# Patient Record
Sex: Female | Born: 1945
Health system: Southern US, Community
[De-identification: ages and names within clinical notes are randomized; demographics above are authoritative.]

## PROBLEM LIST (undated history)

## (undated) DIAGNOSIS — T8859XA Other complications of anesthesia, initial encounter: Secondary | ICD-10-CM

## (undated) DIAGNOSIS — J45909 Unspecified asthma, uncomplicated: Secondary | ICD-10-CM

## (undated) HISTORY — PX: DILATION AND CURETTAGE OF UTERUS: SHX78

## (undated) HISTORY — PX: INNER EAR SURGERY: SHX679

---

## 2013-07-05 DIAGNOSIS — Z79899 Other long term (current) drug therapy: Secondary | ICD-10-CM | POA: Diagnosis not present

## 2013-07-05 DIAGNOSIS — L259 Unspecified contact dermatitis, unspecified cause: Secondary | ICD-10-CM | POA: Diagnosis not present

## 2013-09-19 DIAGNOSIS — L259 Unspecified contact dermatitis, unspecified cause: Secondary | ICD-10-CM | POA: Diagnosis not present

## 2013-09-21 DIAGNOSIS — L259 Unspecified contact dermatitis, unspecified cause: Secondary | ICD-10-CM | POA: Diagnosis not present

## 2013-09-22 DIAGNOSIS — L259 Unspecified contact dermatitis, unspecified cause: Secondary | ICD-10-CM | POA: Diagnosis not present

## 2013-09-27 DIAGNOSIS — Z79899 Other long term (current) drug therapy: Secondary | ICD-10-CM | POA: Diagnosis not present

## 2013-09-27 DIAGNOSIS — L259 Unspecified contact dermatitis, unspecified cause: Secondary | ICD-10-CM | POA: Diagnosis not present

## 2014-01-17 DIAGNOSIS — Z79899 Other long term (current) drug therapy: Secondary | ICD-10-CM | POA: Diagnosis not present

## 2014-01-17 DIAGNOSIS — L259 Unspecified contact dermatitis, unspecified cause: Secondary | ICD-10-CM | POA: Diagnosis not present

## 2014-02-13 DIAGNOSIS — H9190 Unspecified hearing loss, unspecified ear: Secondary | ICD-10-CM | POA: Diagnosis not present

## 2014-02-13 DIAGNOSIS — H6120 Impacted cerumen, unspecified ear: Secondary | ICD-10-CM | POA: Diagnosis not present

## 2014-02-15 DIAGNOSIS — H9190 Unspecified hearing loss, unspecified ear: Secondary | ICD-10-CM | POA: Diagnosis not present

## 2014-02-15 DIAGNOSIS — H6120 Impacted cerumen, unspecified ear: Secondary | ICD-10-CM | POA: Diagnosis not present

## 2014-04-25 DIAGNOSIS — Z5181 Encounter for therapeutic drug level monitoring: Secondary | ICD-10-CM | POA: Diagnosis not present

## 2014-04-25 DIAGNOSIS — Z0000 Encounter for general adult medical examination without abnormal findings: Secondary | ICD-10-CM | POA: Diagnosis not present

## 2014-04-25 DIAGNOSIS — Z800 Family history of malignant neoplasm of digestive organs: Secondary | ICD-10-CM | POA: Diagnosis not present

## 2014-04-25 DIAGNOSIS — Z79899 Other long term (current) drug therapy: Secondary | ICD-10-CM | POA: Diagnosis not present

## 2014-04-25 DIAGNOSIS — L239 Allergic contact dermatitis, unspecified cause: Secondary | ICD-10-CM | POA: Diagnosis not present

## 2014-05-23 DIAGNOSIS — Z86010 Personal history of colonic polyps: Secondary | ICD-10-CM | POA: Diagnosis not present

## 2014-05-23 DIAGNOSIS — Z800 Family history of malignant neoplasm of digestive organs: Secondary | ICD-10-CM | POA: Diagnosis not present

## 2014-05-23 DIAGNOSIS — R194 Change in bowel habit: Secondary | ICD-10-CM | POA: Diagnosis not present

## 2014-05-23 DIAGNOSIS — R159 Full incontinence of feces: Secondary | ICD-10-CM | POA: Diagnosis not present

## 2014-06-26 DIAGNOSIS — D122 Benign neoplasm of ascending colon: Secondary | ICD-10-CM | POA: Diagnosis not present

## 2014-06-26 DIAGNOSIS — Z880 Allergy status to penicillin: Secondary | ICD-10-CM | POA: Diagnosis not present

## 2014-06-26 DIAGNOSIS — M1990 Unspecified osteoarthritis, unspecified site: Secondary | ICD-10-CM | POA: Diagnosis not present

## 2014-06-26 DIAGNOSIS — J45909 Unspecified asthma, uncomplicated: Secondary | ICD-10-CM | POA: Diagnosis not present

## 2014-06-26 DIAGNOSIS — F17210 Nicotine dependence, cigarettes, uncomplicated: Secondary | ICD-10-CM | POA: Diagnosis not present

## 2014-06-26 DIAGNOSIS — Z86010 Personal history of colonic polyps: Secondary | ICD-10-CM | POA: Diagnosis not present

## 2014-06-26 DIAGNOSIS — Z1211 Encounter for screening for malignant neoplasm of colon: Secondary | ICD-10-CM | POA: Diagnosis not present

## 2014-06-26 DIAGNOSIS — D120 Benign neoplasm of cecum: Secondary | ICD-10-CM | POA: Diagnosis not present

## 2014-06-26 DIAGNOSIS — L309 Dermatitis, unspecified: Secondary | ICD-10-CM | POA: Diagnosis not present

## 2014-06-26 DIAGNOSIS — K5790 Diverticulosis of intestine, part unspecified, without perforation or abscess without bleeding: Secondary | ICD-10-CM | POA: Diagnosis not present

## 2014-06-26 DIAGNOSIS — Z800 Family history of malignant neoplasm of digestive organs: Secondary | ICD-10-CM | POA: Diagnosis not present

## 2014-07-25 DIAGNOSIS — Z79899 Other long term (current) drug therapy: Secondary | ICD-10-CM | POA: Diagnosis not present

## 2014-07-25 DIAGNOSIS — Z5181 Encounter for therapeutic drug level monitoring: Secondary | ICD-10-CM | POA: Diagnosis not present

## 2014-07-25 DIAGNOSIS — F17210 Nicotine dependence, cigarettes, uncomplicated: Secondary | ICD-10-CM | POA: Diagnosis not present

## 2014-07-25 DIAGNOSIS — Z23 Encounter for immunization: Secondary | ICD-10-CM | POA: Diagnosis not present

## 2014-07-25 DIAGNOSIS — L299 Pruritus, unspecified: Secondary | ICD-10-CM | POA: Diagnosis not present

## 2014-07-25 DIAGNOSIS — L309 Dermatitis, unspecified: Secondary | ICD-10-CM | POA: Diagnosis not present

## 2014-07-25 DIAGNOSIS — L239 Allergic contact dermatitis, unspecified cause: Secondary | ICD-10-CM | POA: Diagnosis not present

## 2014-07-25 DIAGNOSIS — J45909 Unspecified asthma, uncomplicated: Secondary | ICD-10-CM | POA: Diagnosis not present

## 2014-08-14 DIAGNOSIS — H5213 Myopia, bilateral: Secondary | ICD-10-CM | POA: Diagnosis not present

## 2014-08-14 DIAGNOSIS — H25813 Combined forms of age-related cataract, bilateral: Secondary | ICD-10-CM | POA: Diagnosis not present

## 2014-08-14 DIAGNOSIS — H40003 Preglaucoma, unspecified, bilateral: Secondary | ICD-10-CM | POA: Diagnosis not present

## 2014-08-14 DIAGNOSIS — H524 Presbyopia: Secondary | ICD-10-CM | POA: Diagnosis not present

## 2014-09-08 DIAGNOSIS — K5900 Constipation, unspecified: Secondary | ICD-10-CM | POA: Diagnosis not present

## 2014-09-08 DIAGNOSIS — K5730 Diverticulosis of large intestine without perforation or abscess without bleeding: Secondary | ICD-10-CM | POA: Diagnosis not present

## 2014-09-08 DIAGNOSIS — R197 Diarrhea, unspecified: Secondary | ICD-10-CM | POA: Diagnosis not present

## 2014-09-08 DIAGNOSIS — Z86010 Personal history of colonic polyps: Secondary | ICD-10-CM | POA: Diagnosis not present

## 2014-09-08 DIAGNOSIS — R159 Full incontinence of feces: Secondary | ICD-10-CM | POA: Diagnosis not present

## 2014-11-01 DIAGNOSIS — Z79899 Other long term (current) drug therapy: Secondary | ICD-10-CM | POA: Diagnosis not present

## 2014-11-01 DIAGNOSIS — L239 Allergic contact dermatitis, unspecified cause: Secondary | ICD-10-CM | POA: Diagnosis not present

## 2014-11-01 DIAGNOSIS — Z5181 Encounter for therapeutic drug level monitoring: Secondary | ICD-10-CM | POA: Diagnosis not present

## 2015-01-26 DIAGNOSIS — Z23 Encounter for immunization: Secondary | ICD-10-CM | POA: Diagnosis not present

## 2015-01-31 DIAGNOSIS — L239 Allergic contact dermatitis, unspecified cause: Secondary | ICD-10-CM | POA: Diagnosis not present

## 2015-01-31 DIAGNOSIS — F17210 Nicotine dependence, cigarettes, uncomplicated: Secondary | ICD-10-CM | POA: Diagnosis not present

## 2015-01-31 DIAGNOSIS — T781XXA Other adverse food reactions, not elsewhere classified, initial encounter: Secondary | ICD-10-CM | POA: Diagnosis not present

## 2015-01-31 DIAGNOSIS — R197 Diarrhea, unspecified: Secondary | ICD-10-CM | POA: Diagnosis not present

## 2015-01-31 DIAGNOSIS — H1010 Acute atopic conjunctivitis, unspecified eye: Secondary | ICD-10-CM | POA: Diagnosis not present

## 2015-01-31 DIAGNOSIS — Z8709 Personal history of other diseases of the respiratory system: Secondary | ICD-10-CM | POA: Diagnosis not present

## 2015-02-02 DIAGNOSIS — Z79899 Other long term (current) drug therapy: Secondary | ICD-10-CM | POA: Diagnosis not present

## 2015-02-02 DIAGNOSIS — L2389 Allergic contact dermatitis due to other agents: Secondary | ICD-10-CM | POA: Diagnosis not present

## 2015-02-02 DIAGNOSIS — Z5181 Encounter for therapeutic drug level monitoring: Secondary | ICD-10-CM | POA: Diagnosis not present

## 2015-02-02 DIAGNOSIS — Z880 Allergy status to penicillin: Secondary | ICD-10-CM | POA: Diagnosis not present

## 2015-07-31 DIAGNOSIS — Z880 Allergy status to penicillin: Secondary | ICD-10-CM | POA: Diagnosis not present

## 2015-07-31 DIAGNOSIS — Z5181 Encounter for therapeutic drug level monitoring: Secondary | ICD-10-CM | POA: Diagnosis not present

## 2015-07-31 DIAGNOSIS — L2389 Allergic contact dermatitis due to other agents: Secondary | ICD-10-CM | POA: Diagnosis not present

## 2015-07-31 DIAGNOSIS — J45909 Unspecified asthma, uncomplicated: Secondary | ICD-10-CM | POA: Diagnosis not present

## 2015-07-31 DIAGNOSIS — Z79899 Other long term (current) drug therapy: Secondary | ICD-10-CM | POA: Diagnosis not present

## 2015-07-31 DIAGNOSIS — Z87891 Personal history of nicotine dependence: Secondary | ICD-10-CM | POA: Diagnosis not present

## 2015-08-23 DIAGNOSIS — K219 Gastro-esophageal reflux disease without esophagitis: Secondary | ICD-10-CM | POA: Diagnosis not present

## 2015-09-26 DIAGNOSIS — Z79899 Other long term (current) drug therapy: Secondary | ICD-10-CM | POA: Diagnosis not present

## 2015-09-26 DIAGNOSIS — Z880 Allergy status to penicillin: Secondary | ICD-10-CM | POA: Diagnosis not present

## 2015-09-26 DIAGNOSIS — J45909 Unspecified asthma, uncomplicated: Secondary | ICD-10-CM | POA: Diagnosis not present

## 2015-09-26 DIAGNOSIS — L2389 Allergic contact dermatitis due to other agents: Secondary | ICD-10-CM | POA: Diagnosis not present

## 2015-09-26 DIAGNOSIS — Z87891 Personal history of nicotine dependence: Secondary | ICD-10-CM | POA: Diagnosis not present

## 2015-09-26 DIAGNOSIS — Z5181 Encounter for therapeutic drug level monitoring: Secondary | ICD-10-CM | POA: Diagnosis not present

## 2015-11-23 DIAGNOSIS — K219 Gastro-esophageal reflux disease without esophagitis: Secondary | ICD-10-CM | POA: Diagnosis not present

## 2015-11-23 DIAGNOSIS — J45909 Unspecified asthma, uncomplicated: Secondary | ICD-10-CM | POA: Diagnosis not present

## 2016-01-08 DIAGNOSIS — Z5181 Encounter for therapeutic drug level monitoring: Secondary | ICD-10-CM | POA: Diagnosis not present

## 2016-01-08 DIAGNOSIS — J45909 Unspecified asthma, uncomplicated: Secondary | ICD-10-CM | POA: Diagnosis not present

## 2016-01-08 DIAGNOSIS — Z880 Allergy status to penicillin: Secondary | ICD-10-CM | POA: Diagnosis not present

## 2016-01-08 DIAGNOSIS — Z87891 Personal history of nicotine dependence: Secondary | ICD-10-CM | POA: Diagnosis not present

## 2016-01-08 DIAGNOSIS — L2389 Allergic contact dermatitis due to other agents: Secondary | ICD-10-CM | POA: Diagnosis not present

## 2016-01-08 DIAGNOSIS — Z79899 Other long term (current) drug therapy: Secondary | ICD-10-CM | POA: Diagnosis not present

## 2016-03-03 DIAGNOSIS — J4520 Mild intermittent asthma, uncomplicated: Secondary | ICD-10-CM | POA: Diagnosis not present

## 2016-03-03 DIAGNOSIS — Z1231 Encounter for screening mammogram for malignant neoplasm of breast: Secondary | ICD-10-CM | POA: Diagnosis not present

## 2016-03-03 DIAGNOSIS — Z23 Encounter for immunization: Secondary | ICD-10-CM | POA: Diagnosis not present

## 2016-03-03 DIAGNOSIS — J449 Chronic obstructive pulmonary disease, unspecified: Secondary | ICD-10-CM | POA: Diagnosis not present

## 2016-04-10 DIAGNOSIS — Z83518 Family history of other specified eye disorder: Secondary | ICD-10-CM | POA: Diagnosis not present

## 2016-04-10 DIAGNOSIS — Z809 Family history of malignant neoplasm, unspecified: Secondary | ICD-10-CM | POA: Diagnosis not present

## 2016-04-10 DIAGNOSIS — Z800 Family history of malignant neoplasm of digestive organs: Secondary | ICD-10-CM | POA: Diagnosis not present

## 2016-04-10 DIAGNOSIS — Z79891 Long term (current) use of opiate analgesic: Secondary | ICD-10-CM | POA: Diagnosis not present

## 2016-04-10 DIAGNOSIS — Z9109 Other allergy status, other than to drugs and biological substances: Secondary | ICD-10-CM | POA: Diagnosis not present

## 2016-04-10 DIAGNOSIS — Z79899 Other long term (current) drug therapy: Secondary | ICD-10-CM | POA: Diagnosis not present

## 2016-04-10 DIAGNOSIS — Z880 Allergy status to penicillin: Secondary | ICD-10-CM | POA: Diagnosis not present

## 2016-04-10 DIAGNOSIS — Z888 Allergy status to other drugs, medicaments and biological substances status: Secondary | ICD-10-CM | POA: Diagnosis not present

## 2016-04-10 DIAGNOSIS — Z8371 Family history of colonic polyps: Secondary | ICD-10-CM | POA: Diagnosis not present

## 2016-04-10 DIAGNOSIS — Z87891 Personal history of nicotine dependence: Secondary | ICD-10-CM | POA: Diagnosis not present

## 2016-04-10 DIAGNOSIS — Z91048 Other nonmedicinal substance allergy status: Secondary | ICD-10-CM | POA: Diagnosis not present

## 2016-04-10 DIAGNOSIS — Z9889 Other specified postprocedural states: Secondary | ICD-10-CM | POA: Diagnosis not present

## 2016-04-10 DIAGNOSIS — Z974 Presence of external hearing-aid: Secondary | ICD-10-CM | POA: Diagnosis not present

## 2016-04-10 DIAGNOSIS — Z8489 Family history of other specified conditions: Secondary | ICD-10-CM | POA: Diagnosis not present

## 2016-04-10 DIAGNOSIS — H8093 Unspecified otosclerosis, bilateral: Secondary | ICD-10-CM | POA: Diagnosis not present

## 2016-04-10 DIAGNOSIS — H90A32 Mixed conductive and sensorineural hearing loss, unilateral, left ear with restricted hearing on the contralateral side: Secondary | ICD-10-CM | POA: Diagnosis not present

## 2016-04-10 DIAGNOSIS — Z831 Family history of other infectious and parasitic diseases: Secondary | ICD-10-CM | POA: Diagnosis not present

## 2016-04-23 DIAGNOSIS — J449 Chronic obstructive pulmonary disease, unspecified: Secondary | ICD-10-CM | POA: Diagnosis not present

## 2016-04-23 DIAGNOSIS — H74312 Ankylosis of ear ossicles, left ear: Secondary | ICD-10-CM | POA: Diagnosis not present

## 2016-04-23 DIAGNOSIS — L239 Allergic contact dermatitis, unspecified cause: Secondary | ICD-10-CM | POA: Diagnosis not present

## 2016-04-23 DIAGNOSIS — H9012 Conductive hearing loss, unilateral, left ear, with unrestricted hearing on the contralateral side: Secondary | ICD-10-CM | POA: Diagnosis not present

## 2016-04-23 DIAGNOSIS — H61392 Other acquired stenosis of left external ear canal: Secondary | ICD-10-CM | POA: Diagnosis not present

## 2016-04-23 DIAGNOSIS — H8092 Unspecified otosclerosis, left ear: Secondary | ICD-10-CM | POA: Diagnosis not present

## 2016-04-23 DIAGNOSIS — K219 Gastro-esophageal reflux disease without esophagitis: Secondary | ICD-10-CM | POA: Diagnosis not present

## 2016-04-23 DIAGNOSIS — Z87891 Personal history of nicotine dependence: Secondary | ICD-10-CM | POA: Diagnosis not present

## 2016-04-24 DIAGNOSIS — H61392 Other acquired stenosis of left external ear canal: Secondary | ICD-10-CM | POA: Diagnosis not present

## 2016-04-24 DIAGNOSIS — H8092 Unspecified otosclerosis, left ear: Secondary | ICD-10-CM | POA: Diagnosis not present

## 2016-04-24 DIAGNOSIS — H9012 Conductive hearing loss, unilateral, left ear, with unrestricted hearing on the contralateral side: Secondary | ICD-10-CM | POA: Diagnosis not present

## 2016-04-24 DIAGNOSIS — H74312 Ankylosis of ear ossicles, left ear: Secondary | ICD-10-CM | POA: Diagnosis not present

## 2016-04-24 DIAGNOSIS — J449 Chronic obstructive pulmonary disease, unspecified: Secondary | ICD-10-CM | POA: Diagnosis not present

## 2016-04-24 DIAGNOSIS — K219 Gastro-esophageal reflux disease without esophagitis: Secondary | ICD-10-CM | POA: Diagnosis not present

## 2016-05-02 DIAGNOSIS — Z91048 Other nonmedicinal substance allergy status: Secondary | ICD-10-CM | POA: Diagnosis not present

## 2016-05-02 DIAGNOSIS — Z8489 Family history of other specified conditions: Secondary | ICD-10-CM | POA: Diagnosis not present

## 2016-05-02 DIAGNOSIS — Z9889 Other specified postprocedural states: Secondary | ICD-10-CM | POA: Diagnosis not present

## 2016-05-02 DIAGNOSIS — Z87891 Personal history of nicotine dependence: Secondary | ICD-10-CM | POA: Diagnosis not present

## 2016-05-02 DIAGNOSIS — H90A32 Mixed conductive and sensorineural hearing loss, unilateral, left ear with restricted hearing on the contralateral side: Secondary | ICD-10-CM | POA: Diagnosis not present

## 2016-05-02 DIAGNOSIS — Z83518 Family history of other specified eye disorder: Secondary | ICD-10-CM | POA: Diagnosis not present

## 2016-05-02 DIAGNOSIS — Z79891 Long term (current) use of opiate analgesic: Secondary | ICD-10-CM | POA: Diagnosis not present

## 2016-05-02 DIAGNOSIS — Z800 Family history of malignant neoplasm of digestive organs: Secondary | ICD-10-CM | POA: Diagnosis not present

## 2016-05-02 DIAGNOSIS — Z7951 Long term (current) use of inhaled steroids: Secondary | ICD-10-CM | POA: Diagnosis not present

## 2016-05-02 DIAGNOSIS — Z831 Family history of other infectious and parasitic diseases: Secondary | ICD-10-CM | POA: Diagnosis not present

## 2016-05-02 DIAGNOSIS — Z8371 Family history of colonic polyps: Secondary | ICD-10-CM | POA: Diagnosis not present

## 2016-05-02 DIAGNOSIS — H8092 Unspecified otosclerosis, left ear: Secondary | ICD-10-CM | POA: Diagnosis not present

## 2016-05-02 DIAGNOSIS — Z83511 Family history of glaucoma: Secondary | ICD-10-CM | POA: Diagnosis not present

## 2016-05-02 DIAGNOSIS — Z809 Family history of malignant neoplasm, unspecified: Secondary | ICD-10-CM | POA: Diagnosis not present

## 2016-05-02 DIAGNOSIS — Z79899 Other long term (current) drug therapy: Secondary | ICD-10-CM | POA: Diagnosis not present

## 2016-05-02 DIAGNOSIS — Z880 Allergy status to penicillin: Secondary | ICD-10-CM | POA: Diagnosis not present

## 2016-05-02 DIAGNOSIS — Z4889 Encounter for other specified surgical aftercare: Secondary | ICD-10-CM | POA: Diagnosis not present

## 2016-07-02 DIAGNOSIS — H40003 Preglaucoma, unspecified, bilateral: Secondary | ICD-10-CM | POA: Diagnosis not present

## 2016-07-02 DIAGNOSIS — H01004 Unspecified blepharitis left upper eyelid: Secondary | ICD-10-CM | POA: Diagnosis not present

## 2016-07-02 DIAGNOSIS — J4520 Mild intermittent asthma, uncomplicated: Secondary | ICD-10-CM | POA: Diagnosis not present

## 2016-07-03 DIAGNOSIS — H8092 Unspecified otosclerosis, left ear: Secondary | ICD-10-CM | POA: Diagnosis not present

## 2016-07-03 DIAGNOSIS — Z8371 Family history of colonic polyps: Secondary | ICD-10-CM | POA: Diagnosis not present

## 2016-07-03 DIAGNOSIS — Z880 Allergy status to penicillin: Secondary | ICD-10-CM | POA: Diagnosis not present

## 2016-07-03 DIAGNOSIS — Z8489 Family history of other specified conditions: Secondary | ICD-10-CM | POA: Diagnosis not present

## 2016-07-03 DIAGNOSIS — Z48810 Encounter for surgical aftercare following surgery on the sense organs: Secondary | ICD-10-CM | POA: Diagnosis not present

## 2016-07-03 DIAGNOSIS — Z91048 Other nonmedicinal substance allergy status: Secondary | ICD-10-CM | POA: Diagnosis not present

## 2016-07-03 DIAGNOSIS — Z809 Family history of malignant neoplasm, unspecified: Secondary | ICD-10-CM | POA: Diagnosis not present

## 2016-07-03 DIAGNOSIS — Z79899 Other long term (current) drug therapy: Secondary | ICD-10-CM | POA: Diagnosis not present

## 2016-07-03 DIAGNOSIS — Z87891 Personal history of nicotine dependence: Secondary | ICD-10-CM | POA: Diagnosis not present

## 2016-07-03 DIAGNOSIS — Z83511 Family history of glaucoma: Secondary | ICD-10-CM | POA: Diagnosis not present

## 2016-07-03 DIAGNOSIS — Z7951 Long term (current) use of inhaled steroids: Secondary | ICD-10-CM | POA: Diagnosis not present

## 2016-07-03 DIAGNOSIS — Z800 Family history of malignant neoplasm of digestive organs: Secondary | ICD-10-CM | POA: Diagnosis not present

## 2016-07-08 DIAGNOSIS — H90A32 Mixed conductive and sensorineural hearing loss, unilateral, left ear with restricted hearing on the contralateral side: Secondary | ICD-10-CM | POA: Diagnosis not present

## 2016-08-14 DIAGNOSIS — H52203 Unspecified astigmatism, bilateral: Secondary | ICD-10-CM | POA: Diagnosis not present

## 2016-08-14 DIAGNOSIS — H2513 Age-related nuclear cataract, bilateral: Secondary | ICD-10-CM | POA: Diagnosis not present

## 2016-08-14 DIAGNOSIS — H5213 Myopia, bilateral: Secondary | ICD-10-CM | POA: Diagnosis not present

## 2016-08-14 DIAGNOSIS — H524 Presbyopia: Secondary | ICD-10-CM | POA: Diagnosis not present

## 2016-08-14 DIAGNOSIS — H40013 Open angle with borderline findings, low risk, bilateral: Secondary | ICD-10-CM | POA: Diagnosis not present

## 2016-08-26 DIAGNOSIS — C44311 Basal cell carcinoma of skin of nose: Secondary | ICD-10-CM | POA: Diagnosis not present

## 2016-08-26 DIAGNOSIS — Z79899 Other long term (current) drug therapy: Secondary | ICD-10-CM | POA: Diagnosis not present

## 2016-08-26 DIAGNOSIS — Z87891 Personal history of nicotine dependence: Secondary | ICD-10-CM | POA: Diagnosis not present

## 2016-08-26 DIAGNOSIS — L2389 Allergic contact dermatitis due to other agents: Secondary | ICD-10-CM | POA: Diagnosis not present

## 2016-08-26 DIAGNOSIS — Z5181 Encounter for therapeutic drug level monitoring: Secondary | ICD-10-CM | POA: Diagnosis not present

## 2016-08-26 DIAGNOSIS — J45909 Unspecified asthma, uncomplicated: Secondary | ICD-10-CM | POA: Diagnosis not present

## 2016-08-26 DIAGNOSIS — Z880 Allergy status to penicillin: Secondary | ICD-10-CM | POA: Diagnosis not present

## 2016-08-26 DIAGNOSIS — D485 Neoplasm of uncertain behavior of skin: Secondary | ICD-10-CM | POA: Diagnosis not present

## 2016-09-11 DIAGNOSIS — H40013 Open angle with borderline findings, low risk, bilateral: Secondary | ICD-10-CM | POA: Diagnosis not present

## 2016-09-24 DIAGNOSIS — H40013 Open angle with borderline findings, low risk, bilateral: Secondary | ICD-10-CM | POA: Diagnosis not present

## 2016-09-24 DIAGNOSIS — H40023 Open angle with borderline findings, high risk, bilateral: Secondary | ICD-10-CM | POA: Diagnosis not present

## 2016-09-24 DIAGNOSIS — H2513 Age-related nuclear cataract, bilateral: Secondary | ICD-10-CM | POA: Diagnosis not present

## 2016-10-18 DIAGNOSIS — J0100 Acute maxillary sinusitis, unspecified: Secondary | ICD-10-CM | POA: Diagnosis not present

## 2016-10-20 DIAGNOSIS — Z880 Allergy status to penicillin: Secondary | ICD-10-CM | POA: Diagnosis not present

## 2016-10-20 DIAGNOSIS — Z87891 Personal history of nicotine dependence: Secondary | ICD-10-CM | POA: Diagnosis not present

## 2016-10-20 DIAGNOSIS — C44311 Basal cell carcinoma of skin of nose: Secondary | ICD-10-CM | POA: Diagnosis not present

## 2016-10-30 DIAGNOSIS — Z4802 Encounter for removal of sutures: Secondary | ICD-10-CM | POA: Diagnosis not present

## 2016-10-30 DIAGNOSIS — C44311 Basal cell carcinoma of skin of nose: Secondary | ICD-10-CM | POA: Diagnosis not present

## 2016-10-30 DIAGNOSIS — Z483 Aftercare following surgery for neoplasm: Secondary | ICD-10-CM | POA: Diagnosis not present

## 2016-10-31 DIAGNOSIS — R197 Diarrhea, unspecified: Secondary | ICD-10-CM | POA: Diagnosis not present

## 2016-10-31 DIAGNOSIS — J45909 Unspecified asthma, uncomplicated: Secondary | ICD-10-CM | POA: Diagnosis not present

## 2016-10-31 DIAGNOSIS — R509 Fever, unspecified: Secondary | ICD-10-CM | POA: Diagnosis not present

## 2016-10-31 DIAGNOSIS — J069 Acute upper respiratory infection, unspecified: Secondary | ICD-10-CM | POA: Diagnosis not present

## 2016-11-26 DIAGNOSIS — J45909 Unspecified asthma, uncomplicated: Secondary | ICD-10-CM | POA: Diagnosis not present

## 2016-11-26 DIAGNOSIS — Z880 Allergy status to penicillin: Secondary | ICD-10-CM | POA: Diagnosis not present

## 2016-11-26 DIAGNOSIS — Z85828 Personal history of other malignant neoplasm of skin: Secondary | ICD-10-CM | POA: Diagnosis not present

## 2016-11-26 DIAGNOSIS — Z5181 Encounter for therapeutic drug level monitoring: Secondary | ICD-10-CM | POA: Diagnosis not present

## 2016-11-26 DIAGNOSIS — Z79899 Other long term (current) drug therapy: Secondary | ICD-10-CM | POA: Diagnosis not present

## 2016-11-26 DIAGNOSIS — L2389 Allergic contact dermatitis due to other agents: Secondary | ICD-10-CM | POA: Diagnosis not present

## 2016-11-26 DIAGNOSIS — Z87891 Personal history of nicotine dependence: Secondary | ICD-10-CM | POA: Diagnosis not present

## 2016-12-10 DIAGNOSIS — Z483 Aftercare following surgery for neoplasm: Secondary | ICD-10-CM | POA: Diagnosis not present

## 2016-12-10 DIAGNOSIS — C44311 Basal cell carcinoma of skin of nose: Secondary | ICD-10-CM | POA: Diagnosis not present

## 2017-01-12 DIAGNOSIS — H40013 Open angle with borderline findings, low risk, bilateral: Secondary | ICD-10-CM | POA: Diagnosis not present

## 2017-01-12 DIAGNOSIS — H25013 Cortical age-related cataract, bilateral: Secondary | ICD-10-CM | POA: Diagnosis not present

## 2017-01-12 DIAGNOSIS — H2511 Age-related nuclear cataract, right eye: Secondary | ICD-10-CM | POA: Diagnosis not present

## 2017-01-12 DIAGNOSIS — H25011 Cortical age-related cataract, right eye: Secondary | ICD-10-CM | POA: Diagnosis not present

## 2017-01-12 DIAGNOSIS — H15842 Scleral ectasia, left eye: Secondary | ICD-10-CM | POA: Diagnosis not present

## 2017-01-12 DIAGNOSIS — H2513 Age-related nuclear cataract, bilateral: Secondary | ICD-10-CM | POA: Diagnosis not present

## 2017-01-26 DIAGNOSIS — H40013 Open angle with borderline findings, low risk, bilateral: Secondary | ICD-10-CM | POA: Diagnosis not present

## 2017-01-26 DIAGNOSIS — H2513 Age-related nuclear cataract, bilateral: Secondary | ICD-10-CM | POA: Diagnosis not present

## 2017-03-31 DIAGNOSIS — H2511 Age-related nuclear cataract, right eye: Secondary | ICD-10-CM | POA: Diagnosis not present

## 2017-03-31 DIAGNOSIS — H268 Other specified cataract: Secondary | ICD-10-CM | POA: Diagnosis not present

## 2017-04-15 DIAGNOSIS — H25012 Cortical age-related cataract, left eye: Secondary | ICD-10-CM | POA: Diagnosis not present

## 2017-04-15 DIAGNOSIS — H2512 Age-related nuclear cataract, left eye: Secondary | ICD-10-CM | POA: Diagnosis not present

## 2017-04-21 DIAGNOSIS — H25812 Combined forms of age-related cataract, left eye: Secondary | ICD-10-CM | POA: Diagnosis not present

## 2017-04-21 DIAGNOSIS — H2512 Age-related nuclear cataract, left eye: Secondary | ICD-10-CM | POA: Diagnosis not present

## 2017-05-28 DIAGNOSIS — L821 Other seborrheic keratosis: Secondary | ICD-10-CM | POA: Diagnosis not present

## 2017-05-28 DIAGNOSIS — Z7189 Other specified counseling: Secondary | ICD-10-CM | POA: Diagnosis not present

## 2017-05-28 DIAGNOSIS — Z79899 Other long term (current) drug therapy: Secondary | ICD-10-CM | POA: Diagnosis not present

## 2017-05-28 DIAGNOSIS — L2389 Allergic contact dermatitis due to other agents: Secondary | ICD-10-CM | POA: Diagnosis not present

## 2017-05-28 DIAGNOSIS — Z5181 Encounter for therapeutic drug level monitoring: Secondary | ICD-10-CM | POA: Diagnosis not present

## 2017-06-08 DIAGNOSIS — H47022 Hemorrhage in optic nerve sheath, left eye: Secondary | ICD-10-CM | POA: Diagnosis not present

## 2017-06-10 DIAGNOSIS — Z23 Encounter for immunization: Secondary | ICD-10-CM | POA: Diagnosis not present

## 2017-06-23 HISTORY — PX: EYE SURGERY: SHX253

## 2017-07-15 DIAGNOSIS — H1013 Acute atopic conjunctivitis, bilateral: Secondary | ICD-10-CM | POA: Diagnosis not present

## 2017-07-15 DIAGNOSIS — H40013 Open angle with borderline findings, low risk, bilateral: Secondary | ICD-10-CM | POA: Diagnosis not present

## 2017-09-21 DIAGNOSIS — H35439 Paving stone degeneration of retina, unspecified eye: Secondary | ICD-10-CM | POA: Diagnosis not present

## 2017-09-21 DIAGNOSIS — Z961 Presence of intraocular lens: Secondary | ICD-10-CM | POA: Diagnosis not present

## 2017-09-21 DIAGNOSIS — H40013 Open angle with borderline findings, low risk, bilateral: Secondary | ICD-10-CM | POA: Diagnosis not present

## 2017-09-21 DIAGNOSIS — H31002 Unspecified chorioretinal scars, left eye: Secondary | ICD-10-CM | POA: Diagnosis not present

## 2017-10-21 DIAGNOSIS — J209 Acute bronchitis, unspecified: Secondary | ICD-10-CM | POA: Diagnosis not present

## 2017-10-21 DIAGNOSIS — J329 Chronic sinusitis, unspecified: Secondary | ICD-10-CM | POA: Diagnosis not present

## 2017-10-21 DIAGNOSIS — R32 Unspecified urinary incontinence: Secondary | ICD-10-CM | POA: Diagnosis not present

## 2017-10-21 DIAGNOSIS — J45909 Unspecified asthma, uncomplicated: Secondary | ICD-10-CM | POA: Diagnosis not present

## 2017-11-12 DIAGNOSIS — S30861A Insect bite (nonvenomous) of abdominal wall, initial encounter: Secondary | ICD-10-CM | POA: Diagnosis not present

## 2017-11-12 DIAGNOSIS — W57XXXA Bitten or stung by nonvenomous insect and other nonvenomous arthropods, initial encounter: Secondary | ICD-10-CM | POA: Diagnosis not present

## 2017-11-18 DIAGNOSIS — H40013 Open angle with borderline findings, low risk, bilateral: Secondary | ICD-10-CM | POA: Diagnosis not present

## 2017-11-18 DIAGNOSIS — H15842 Scleral ectasia, left eye: Secondary | ICD-10-CM | POA: Diagnosis not present

## 2017-11-18 DIAGNOSIS — H02839 Dermatochalasis of unspecified eye, unspecified eyelid: Secondary | ICD-10-CM | POA: Diagnosis not present

## 2017-11-18 DIAGNOSIS — H1013 Acute atopic conjunctivitis, bilateral: Secondary | ICD-10-CM | POA: Diagnosis not present

## 2017-11-26 DIAGNOSIS — Z79899 Other long term (current) drug therapy: Secondary | ICD-10-CM | POA: Diagnosis not present

## 2017-11-26 DIAGNOSIS — L2389 Allergic contact dermatitis due to other agents: Secondary | ICD-10-CM | POA: Diagnosis not present

## 2017-11-26 DIAGNOSIS — Z5181 Encounter for therapeutic drug level monitoring: Secondary | ICD-10-CM | POA: Diagnosis not present

## 2017-12-02 DIAGNOSIS — M65311 Trigger thumb, right thumb: Secondary | ICD-10-CM | POA: Diagnosis not present

## 2017-12-02 DIAGNOSIS — S30861D Insect bite (nonvenomous) of abdominal wall, subsequent encounter: Secondary | ICD-10-CM | POA: Diagnosis not present

## 2017-12-02 DIAGNOSIS — W57XXXA Bitten or stung by nonvenomous insect and other nonvenomous arthropods, initial encounter: Secondary | ICD-10-CM | POA: Diagnosis not present

## 2017-12-07 DIAGNOSIS — M65311 Trigger thumb, right thumb: Secondary | ICD-10-CM | POA: Diagnosis not present

## 2017-12-22 DIAGNOSIS — M184 Other bilateral secondary osteoarthritis of first carpometacarpal joints: Secondary | ICD-10-CM | POA: Diagnosis not present

## 2017-12-22 DIAGNOSIS — M25541 Pain in joints of right hand: Secondary | ICD-10-CM | POA: Diagnosis not present

## 2017-12-22 DIAGNOSIS — M65311 Trigger thumb, right thumb: Secondary | ICD-10-CM | POA: Diagnosis not present

## 2017-12-23 DIAGNOSIS — N952 Postmenopausal atrophic vaginitis: Secondary | ICD-10-CM | POA: Diagnosis not present

## 2017-12-23 DIAGNOSIS — N393 Stress incontinence (female) (male): Secondary | ICD-10-CM | POA: Diagnosis not present

## 2017-12-29 DIAGNOSIS — M25541 Pain in joints of right hand: Secondary | ICD-10-CM | POA: Diagnosis not present

## 2017-12-29 DIAGNOSIS — M65311 Trigger thumb, right thumb: Secondary | ICD-10-CM | POA: Diagnosis not present

## 2017-12-29 DIAGNOSIS — M184 Other bilateral secondary osteoarthritis of first carpometacarpal joints: Secondary | ICD-10-CM | POA: Diagnosis not present

## 2018-01-11 DIAGNOSIS — M6281 Muscle weakness (generalized): Secondary | ICD-10-CM | POA: Diagnosis not present

## 2018-01-11 DIAGNOSIS — N3946 Mixed incontinence: Secondary | ICD-10-CM | POA: Diagnosis not present

## 2018-01-19 DIAGNOSIS — M184 Other bilateral secondary osteoarthritis of first carpometacarpal joints: Secondary | ICD-10-CM | POA: Diagnosis not present

## 2018-01-19 DIAGNOSIS — M25541 Pain in joints of right hand: Secondary | ICD-10-CM | POA: Diagnosis not present

## 2018-01-19 DIAGNOSIS — M65311 Trigger thumb, right thumb: Secondary | ICD-10-CM | POA: Diagnosis not present

## 2018-01-22 DIAGNOSIS — M6289 Other specified disorders of muscle: Secondary | ICD-10-CM | POA: Diagnosis not present

## 2018-01-22 DIAGNOSIS — M62838 Other muscle spasm: Secondary | ICD-10-CM | POA: Diagnosis not present

## 2018-01-22 DIAGNOSIS — M6281 Muscle weakness (generalized): Secondary | ICD-10-CM | POA: Diagnosis not present

## 2018-01-22 DIAGNOSIS — N393 Stress incontinence (female) (male): Secondary | ICD-10-CM | POA: Diagnosis not present

## 2018-02-04 DIAGNOSIS — M25541 Pain in joints of right hand: Secondary | ICD-10-CM | POA: Diagnosis not present

## 2018-02-04 DIAGNOSIS — M184 Other bilateral secondary osteoarthritis of first carpometacarpal joints: Secondary | ICD-10-CM | POA: Diagnosis not present

## 2018-02-04 DIAGNOSIS — M65311 Trigger thumb, right thumb: Secondary | ICD-10-CM | POA: Diagnosis not present

## 2018-02-16 DIAGNOSIS — M6289 Other specified disorders of muscle: Secondary | ICD-10-CM | POA: Diagnosis not present

## 2018-02-16 DIAGNOSIS — M6281 Muscle weakness (generalized): Secondary | ICD-10-CM | POA: Diagnosis not present

## 2018-02-16 DIAGNOSIS — M62838 Other muscle spasm: Secondary | ICD-10-CM | POA: Diagnosis not present

## 2018-02-16 DIAGNOSIS — N393 Stress incontinence (female) (male): Secondary | ICD-10-CM | POA: Diagnosis not present

## 2018-02-18 DIAGNOSIS — H40013 Open angle with borderline findings, low risk, bilateral: Secondary | ICD-10-CM | POA: Diagnosis not present

## 2018-02-18 DIAGNOSIS — H15842 Scleral ectasia, left eye: Secondary | ICD-10-CM | POA: Diagnosis not present

## 2018-02-18 DIAGNOSIS — H02839 Dermatochalasis of unspecified eye, unspecified eyelid: Secondary | ICD-10-CM | POA: Diagnosis not present

## 2018-02-18 DIAGNOSIS — H1013 Acute atopic conjunctivitis, bilateral: Secondary | ICD-10-CM | POA: Diagnosis not present

## 2018-02-25 DIAGNOSIS — M170 Bilateral primary osteoarthritis of knee: Secondary | ICD-10-CM | POA: Diagnosis not present

## 2018-03-11 DIAGNOSIS — M6281 Muscle weakness (generalized): Secondary | ICD-10-CM | POA: Diagnosis not present

## 2018-03-11 DIAGNOSIS — M6289 Other specified disorders of muscle: Secondary | ICD-10-CM | POA: Diagnosis not present

## 2018-03-11 DIAGNOSIS — M62838 Other muscle spasm: Secondary | ICD-10-CM | POA: Diagnosis not present

## 2018-03-11 DIAGNOSIS — N393 Stress incontinence (female) (male): Secondary | ICD-10-CM | POA: Diagnosis not present

## 2018-03-17 DIAGNOSIS — M170 Bilateral primary osteoarthritis of knee: Secondary | ICD-10-CM | POA: Diagnosis not present

## 2018-03-19 DIAGNOSIS — R300 Dysuria: Secondary | ICD-10-CM | POA: Diagnosis not present

## 2018-03-24 DIAGNOSIS — N393 Stress incontinence (female) (male): Secondary | ICD-10-CM | POA: Diagnosis not present

## 2018-03-24 DIAGNOSIS — N952 Postmenopausal atrophic vaginitis: Secondary | ICD-10-CM | POA: Diagnosis not present

## 2018-04-16 DIAGNOSIS — N3946 Mixed incontinence: Secondary | ICD-10-CM | POA: Diagnosis not present

## 2018-04-16 DIAGNOSIS — M62838 Other muscle spasm: Secondary | ICD-10-CM | POA: Diagnosis not present

## 2018-04-16 DIAGNOSIS — M6289 Other specified disorders of muscle: Secondary | ICD-10-CM | POA: Diagnosis not present

## 2018-04-16 DIAGNOSIS — M6281 Muscle weakness (generalized): Secondary | ICD-10-CM | POA: Diagnosis not present

## 2018-05-05 DIAGNOSIS — M62838 Other muscle spasm: Secondary | ICD-10-CM | POA: Diagnosis not present

## 2018-05-05 DIAGNOSIS — M6281 Muscle weakness (generalized): Secondary | ICD-10-CM | POA: Diagnosis not present

## 2018-05-05 DIAGNOSIS — N3946 Mixed incontinence: Secondary | ICD-10-CM | POA: Diagnosis not present

## 2018-05-05 DIAGNOSIS — M6289 Other specified disorders of muscle: Secondary | ICD-10-CM | POA: Diagnosis not present

## 2018-05-10 DIAGNOSIS — H15842 Scleral ectasia, left eye: Secondary | ICD-10-CM | POA: Diagnosis not present

## 2018-05-10 DIAGNOSIS — H1013 Acute atopic conjunctivitis, bilateral: Secondary | ICD-10-CM | POA: Diagnosis not present

## 2018-05-10 DIAGNOSIS — H40013 Open angle with borderline findings, low risk, bilateral: Secondary | ICD-10-CM | POA: Diagnosis not present

## 2018-05-24 DIAGNOSIS — M62838 Other muscle spasm: Secondary | ICD-10-CM | POA: Diagnosis not present

## 2018-05-24 DIAGNOSIS — M6289 Other specified disorders of muscle: Secondary | ICD-10-CM | POA: Diagnosis not present

## 2018-05-24 DIAGNOSIS — M6281 Muscle weakness (generalized): Secondary | ICD-10-CM | POA: Diagnosis not present

## 2018-05-24 DIAGNOSIS — N393 Stress incontinence (female) (male): Secondary | ICD-10-CM | POA: Diagnosis not present

## 2018-05-26 DIAGNOSIS — L814 Other melanin hyperpigmentation: Secondary | ICD-10-CM | POA: Diagnosis not present

## 2018-05-26 DIAGNOSIS — Z87891 Personal history of nicotine dependence: Secondary | ICD-10-CM | POA: Diagnosis not present

## 2018-05-26 DIAGNOSIS — Z79899 Other long term (current) drug therapy: Secondary | ICD-10-CM | POA: Diagnosis not present

## 2018-05-26 DIAGNOSIS — L2389 Allergic contact dermatitis due to other agents: Secondary | ICD-10-CM | POA: Diagnosis not present

## 2018-05-26 DIAGNOSIS — L821 Other seborrheic keratosis: Secondary | ICD-10-CM | POA: Diagnosis not present

## 2018-05-26 DIAGNOSIS — Z5181 Encounter for therapeutic drug level monitoring: Secondary | ICD-10-CM | POA: Diagnosis not present

## 2018-05-26 DIAGNOSIS — Z85828 Personal history of other malignant neoplasm of skin: Secondary | ICD-10-CM | POA: Diagnosis not present

## 2018-06-29 DIAGNOSIS — M62838 Other muscle spasm: Secondary | ICD-10-CM | POA: Diagnosis not present

## 2018-06-29 DIAGNOSIS — N393 Stress incontinence (female) (male): Secondary | ICD-10-CM | POA: Diagnosis not present

## 2018-06-29 DIAGNOSIS — M5441 Lumbago with sciatica, right side: Secondary | ICD-10-CM | POA: Diagnosis not present

## 2018-06-29 DIAGNOSIS — M6289 Other specified disorders of muscle: Secondary | ICD-10-CM | POA: Diagnosis not present

## 2018-06-29 DIAGNOSIS — M6281 Muscle weakness (generalized): Secondary | ICD-10-CM | POA: Diagnosis not present

## 2018-07-15 DIAGNOSIS — M6281 Muscle weakness (generalized): Secondary | ICD-10-CM | POA: Diagnosis not present

## 2018-07-15 DIAGNOSIS — M6289 Other specified disorders of muscle: Secondary | ICD-10-CM | POA: Diagnosis not present

## 2018-07-15 DIAGNOSIS — M62838 Other muscle spasm: Secondary | ICD-10-CM | POA: Diagnosis not present

## 2018-07-15 DIAGNOSIS — N3946 Mixed incontinence: Secondary | ICD-10-CM | POA: Diagnosis not present

## 2018-08-20 DIAGNOSIS — Z23 Encounter for immunization: Secondary | ICD-10-CM | POA: Diagnosis not present

## 2018-11-25 DIAGNOSIS — L2389 Allergic contact dermatitis due to other agents: Secondary | ICD-10-CM | POA: Diagnosis not present

## 2018-11-25 DIAGNOSIS — Z5181 Encounter for therapeutic drug level monitoring: Secondary | ICD-10-CM | POA: Diagnosis not present

## 2018-11-25 DIAGNOSIS — L821 Other seborrheic keratosis: Secondary | ICD-10-CM | POA: Diagnosis not present

## 2018-11-25 DIAGNOSIS — Z85828 Personal history of other malignant neoplasm of skin: Secondary | ICD-10-CM | POA: Diagnosis not present

## 2018-11-25 DIAGNOSIS — Z79899 Other long term (current) drug therapy: Secondary | ICD-10-CM | POA: Diagnosis not present

## 2018-11-25 DIAGNOSIS — L235 Allergic contact dermatitis due to other chemical products: Secondary | ICD-10-CM | POA: Diagnosis not present

## 2018-11-25 DIAGNOSIS — D229 Melanocytic nevi, unspecified: Secondary | ICD-10-CM | POA: Diagnosis not present

## 2018-11-25 DIAGNOSIS — Z87891 Personal history of nicotine dependence: Secondary | ICD-10-CM | POA: Diagnosis not present

## 2019-04-07 DIAGNOSIS — Z23 Encounter for immunization: Secondary | ICD-10-CM | POA: Diagnosis not present

## 2019-05-27 DIAGNOSIS — L235 Allergic contact dermatitis due to other chemical products: Secondary | ICD-10-CM | POA: Diagnosis not present

## 2019-05-27 DIAGNOSIS — L814 Other melanin hyperpigmentation: Secondary | ICD-10-CM | POA: Diagnosis not present

## 2019-05-27 DIAGNOSIS — Z79899 Other long term (current) drug therapy: Secondary | ICD-10-CM | POA: Diagnosis not present

## 2019-05-27 DIAGNOSIS — Z87891 Personal history of nicotine dependence: Secondary | ICD-10-CM | POA: Diagnosis not present

## 2019-05-27 DIAGNOSIS — L2389 Allergic contact dermatitis due to other agents: Secondary | ICD-10-CM | POA: Diagnosis not present

## 2019-05-27 DIAGNOSIS — L821 Other seborrheic keratosis: Secondary | ICD-10-CM | POA: Diagnosis not present

## 2019-05-27 DIAGNOSIS — Z7689 Persons encountering health services in other specified circumstances: Secondary | ICD-10-CM | POA: Diagnosis not present

## 2019-05-27 DIAGNOSIS — Z85828 Personal history of other malignant neoplasm of skin: Secondary | ICD-10-CM | POA: Diagnosis not present

## 2019-05-27 DIAGNOSIS — Z5181 Encounter for therapeutic drug level monitoring: Secondary | ICD-10-CM | POA: Diagnosis not present

## 2019-05-27 DIAGNOSIS — B351 Tinea unguium: Secondary | ICD-10-CM | POA: Diagnosis not present

## 2019-07-21 DIAGNOSIS — Z23 Encounter for immunization: Secondary | ICD-10-CM | POA: Diagnosis not present

## 2019-07-22 ENCOUNTER — Ambulatory Visit: Payer: Self-pay

## 2019-08-02 ENCOUNTER — Ambulatory Visit: Payer: Self-pay

## 2019-08-25 DIAGNOSIS — Z23 Encounter for immunization: Secondary | ICD-10-CM | POA: Diagnosis not present

## 2019-09-15 DIAGNOSIS — K0825 Moderate atrophy of the maxilla: Secondary | ICD-10-CM | POA: Diagnosis not present

## 2019-10-27 DIAGNOSIS — K029 Dental caries, unspecified: Secondary | ICD-10-CM | POA: Diagnosis not present

## 2019-11-10 DIAGNOSIS — R17 Unspecified jaundice: Secondary | ICD-10-CM | POA: Diagnosis not present

## 2019-11-10 DIAGNOSIS — R1310 Dysphagia, unspecified: Secondary | ICD-10-CM | POA: Diagnosis not present

## 2019-11-10 DIAGNOSIS — J45909 Unspecified asthma, uncomplicated: Secondary | ICD-10-CM | POA: Diagnosis not present

## 2019-11-10 DIAGNOSIS — N952 Postmenopausal atrophic vaginitis: Secondary | ICD-10-CM | POA: Diagnosis not present

## 2019-11-10 DIAGNOSIS — R197 Diarrhea, unspecified: Secondary | ICD-10-CM | POA: Diagnosis not present

## 2019-11-10 DIAGNOSIS — M25561 Pain in right knee: Secondary | ICD-10-CM | POA: Diagnosis not present

## 2019-11-10 DIAGNOSIS — K219 Gastro-esophageal reflux disease without esophagitis: Secondary | ICD-10-CM | POA: Diagnosis not present

## 2019-11-10 DIAGNOSIS — Z7689 Persons encountering health services in other specified circumstances: Secondary | ICD-10-CM | POA: Diagnosis not present

## 2019-11-10 DIAGNOSIS — L309 Dermatitis, unspecified: Secondary | ICD-10-CM | POA: Diagnosis not present

## 2019-11-10 DIAGNOSIS — Z0000 Encounter for general adult medical examination without abnormal findings: Secondary | ICD-10-CM | POA: Diagnosis not present

## 2019-12-25 ENCOUNTER — Encounter (HOSPITAL_COMMUNITY): Payer: Self-pay | Admitting: Family Medicine

## 2019-12-25 ENCOUNTER — Other Ambulatory Visit: Payer: Self-pay

## 2019-12-25 ENCOUNTER — Ambulatory Visit (HOSPITAL_COMMUNITY)
Admission: EM | Admit: 2019-12-25 | Discharge: 2019-12-25 | Disposition: A | Discharge status: Home / Self Care | Payer: Federal, State, Local not specified - PPO | Attending: Family Medicine | Admitting: Family Medicine

## 2019-12-25 DIAGNOSIS — R197 Diarrhea, unspecified: Secondary | ICD-10-CM | POA: Diagnosis not present

## 2019-12-25 DIAGNOSIS — R21 Rash and other nonspecific skin eruption: Secondary | ICD-10-CM | POA: Diagnosis not present

## 2019-12-25 MED ORDER — FLUCONAZOLE 150 MG PO TABS
150.0000 mg | ORAL_TABLET | Freq: Once | ORAL | 0 refills | Status: AC
Start: 2019-12-25 — End: 2019-12-25

## 2019-12-25 MED ORDER — METRONIDAZOLE 500 MG PO TABS
500.0000 mg | ORAL_TABLET | Freq: Two times a day (BID) | ORAL | 0 refills | Status: DC
Start: 2019-12-25 — End: 2020-01-19

## 2019-12-25 MED ORDER — KETOCONAZOLE 2 % EX CREA
1.0000 | TOPICAL_CREAM | Freq: Two times a day (BID) | CUTANEOUS | 0 refills | Status: AC
Start: 2019-12-25 — End: ?

## 2019-12-25 NOTE — Discharge Instructions (Addendum)
Imodium 4 mg tonight as needed

## 2019-12-25 NOTE — ED Triage Notes (Signed)
Patient presents today with complaints of diarrhea x 5 today. Patient states she has not eaten anything out of the ordinary. Patient states her dog did have diarrhea last night - she cleaned the area very well  - she did not have on clothes when she cleaned the area but washed her hands very afterwards.

## 2019-12-25 NOTE — ED Provider Notes (Signed)
[facility]    CSN: [id number] Arrival date & time: 12/25/19  1700      History   Chief Complaint Chief Complaint  Patient presents with  . Rash  . Diarrhea    HPI Julia Riley is a 74 y.o. female.   Initial [facility] patient visit.  Patient presents today with complaints of diarrhea x 5 today. Patient states she has not eaten anything out of the ordinary. Patient states her dog did have diarrhea last night - she cleaned the area very well  - she did not have on clothes when she cleaned the area but washed her hands very afterwards.   Patient has no abdominal pain.  She has seen no blood per rectum.  She does take methotrexate 7.5 mg each weekend.  She was recently on doxycycline for a tick bite and her last tablet for that was 2 days ago.  She is having no symptoms of tick fever.  Patient is having mild rash under her breasts as well as along the elastic line of her underwear.  Her vaginal area is very pruritic and red.  Last bowel movement was over an hour ago when she feels fine now.     History reviewed. No pertinent past medical history.  There are no problems to display for this patient.   History reviewed. No pertinent surgical history.  OB History   No obstetric history on file.      Home Medications    Prior to Admission medications   Medication Sig Start Date End Date Taking? Authorizing Provider  Fluticasone-Salmeterol (ADVAIR) 100-50 MCG/DOSE AEPB Inhale 1 puff into the lungs as needed. 11/10/19  Yes [provider]  folic acid (FOLVITE) 1 MG tablet Take 1 tablet by mouth daily. 08/26/16  Yes [provider]  methotrexate (RHEUMATREX) 2.5 MG tablet Take 3 tablets by mouth once a week. 10/08/16  Yes [provider]  fluconazole (DIFLUCAN) 150 MG tablet Take 1 tablet (150 mg total) by mouth once for 1 dose. Repeat if needed 12/25/19 12/25/19  Robyn Haber, MD  ketoconazole (NIZORAL) 2 % cream Apply 1 application  topically 2 (two) times daily. 12/25/19   Robyn Haber, MD  metroNIDAZOLE (FLAGYL) 500 MG tablet Take 1 tablet (500 mg total) by mouth 2 (two) times daily. 12/25/19   Robyn Haber, MD  Multiple Vitamin (MULTI-VITAMIN) tablet Take 1 tablet by mouth daily.    [provider]    Family History History reviewed. No pertinent family history.  Social History Social History   Tobacco Use  . Smoking status: Never Smoker  . Smokeless tobacco: Never Used  Substance Use Topics  . Alcohol use: Not Currently  . Drug use: Not Currently     Allergies   Sulfamethoxazole-trimethoprim, Other, Penicillins, Bupropion, and Lamotrigine   Review of Systems Review of Systems  Constitutional: Negative.   Gastrointestinal: Positive for diarrhea and nausea. Negative for abdominal pain and vomiting.  Genitourinary: Positive for vaginal pain.  Skin: Positive for rash.  Neurological: Positive for light-headedness.  All other systems reviewed and are negative.    Physical Exam Triage Vital Signs ED Triage Vitals  Enc Vitals Group     BP 12/25/19 1752 136/75     Pulse Rate 12/25/19 1752 79     Resp 12/25/19 1752 18     Temp 12/25/19 1752 98.4 F (36.9 C)     Temp Source 12/25/19 1752 Oral     SpO2 12/25/19 1752 95 %  Weight --      Height --      Head Circumference --      Peak Flow --      Pain Score 12/25/19 1754 0     Pain Loc --      Pain Edu? --      Excl. in [place]? --    No data found.  Updated Vital Signs BP 136/75 (BP Location: Left Arm)   Pulse 79   Temp 98.4 F (36.9 C) (Oral)   Resp 18   SpO2 95%    Physical Exam Vitals and nursing note reviewed.  Constitutional:      Appearance: Normal appearance. She is normal weight.  Eyes:     Conjunctiva/sclera: Conjunctivae normal.  Cardiovascular:     Rate and Rhythm: Normal rate.  Pulmonary:     Effort: Pulmonary effort is normal.  Abdominal:     General: Bowel sounds are normal.     Palpations: Abdomen is  soft. There is no mass.     Tenderness: There is no abdominal tenderness. There is no guarding or rebound.  Musculoskeletal:        General: Normal range of motion.     Cervical back: Normal range of motion and neck supple.  Skin:    General: Skin is warm.  Neurological:     General: No focal deficit present.     Mental Status: She is alert and oriented to person, place, and time.  Psychiatric:        Mood and Affect: Mood normal.        Behavior: Behavior normal.        Thought Content: Thought content normal.        Judgment: Judgment normal.      UC Treatments / Results  Labs (all labs ordered are listed, but only abnormal results are displayed) Labs Reviewed - No data to display  EKG   Radiology No results found.  Procedures Procedures (including critical care time)  Medications Ordered in UC Medications - No data to display  Initial Impression / Assessment and Plan / UC Course  I have reviewed the triage vital signs and the nursing notes.  Pertinent labs & imaging results that were available during my care of the patient were reviewed by me and considered in my medical decision making (see chart for details).    Final Clinical Impressions(s) / UC Diagnoses   Final diagnoses:  Diarrhea, unspecified type  Rash     Discharge Instructions     Imodium 4 mg tonight as needed    ED Prescriptions    Medication Sig Dispense Auth. Provider   fluconazole (DIFLUCAN) 150 MG tablet Take 1 tablet (150 mg total) by mouth once for 1 dose. Repeat if needed 2 tablet Robyn Haber, MD   ketoconazole (NIZORAL) 2 % cream Apply 1 application topically 2 (two) times daily. 30 g Robyn Haber, MD   metroNIDAZOLE (FLAGYL) 500 MG tablet Take 1 tablet (500 mg total) by mouth 2 (two) times daily. 14 tablet Robyn Haber, MD     I have reviewed the PDMP during this encounter.   Robyn Haber, MD 12/25/19 1820

## 2020-01-16 ENCOUNTER — Encounter (HOSPITAL_COMMUNITY): Payer: Self-pay | Admitting: Emergency Medicine

## 2020-01-16 ENCOUNTER — Ambulatory Visit (INDEPENDENT_AMBULATORY_CARE_PROVIDER_SITE_OTHER): Payer: Federal, State, Local not specified - PPO

## 2020-01-16 ENCOUNTER — Ambulatory Visit (HOSPITAL_COMMUNITY)
Admission: EM | Admit: 2020-01-16 | Discharge: 2020-01-16 | Disposition: A | Discharge status: Home / Self Care | Payer: Federal, State, Local not specified - PPO | Attending: Family Medicine | Admitting: Family Medicine

## 2020-01-16 ENCOUNTER — Other Ambulatory Visit: Payer: Self-pay

## 2020-01-16 DIAGNOSIS — M79601 Pain in right arm: Secondary | ICD-10-CM

## 2020-01-16 DIAGNOSIS — W19XXXA Unspecified fall, initial encounter: Secondary | ICD-10-CM

## 2020-01-16 DIAGNOSIS — S52572A Other intraarticular fracture of lower end of left radius, initial encounter for closed fracture: Secondary | ICD-10-CM

## 2020-01-16 DIAGNOSIS — M7989 Other specified soft tissue disorders: Secondary | ICD-10-CM

## 2020-01-16 MED ORDER — HYDROCODONE-ACETAMINOPHEN 5-325 MG PO TABS: 1.0000 | ORAL_TABLET | Freq: Four times a day (QID) | ORAL | 0 refills | Status: DC | PRN

## 2020-01-16 NOTE — ED Provider Notes (Signed)
[facility]    CSN: [id number] Arrival date & time: 01/16/20  [place]      History   Chief Complaint Chief Complaint  Patient presents with  . Fall  . Arm Pain    HPI Audia Amick is a 74 y.o. female.   HPI  Patient fell on outstretched hand yesterday, tripped over her dog Her arm is very painful, swollen, states she cannot move her fingers States she has been taking pain medication.  No known bone loss or bone disease although she does have extensive arthritis  History reviewed. No pertinent past medical history.  There are no problems to display for this patient.   History reviewed. No pertinent surgical history.  OB History   No obstetric history on file.      Home Medications    Prior to Admission medications   Medication Sig Start Date End Date Taking? Authorizing Provider  Fluticasone-Salmeterol (ADVAIR) 100-50 MCG/DOSE AEPB Inhale 1 puff into the lungs as needed. 11/10/19  Yes [provider]  folic acid (FOLVITE) 1 MG tablet Take 1 tablet by mouth daily. 08/26/16  Yes [provider]  ketoconazole (NIZORAL) 2 % cream Apply 1 application topically 2 (two) times daily. 12/25/19  Yes Robyn Haber, MD  methotrexate (RHEUMATREX) 2.5 MG tablet Take 3 tablets by mouth once a week. 10/08/16  Yes [provider]  Multiple Vitamin (MULTI-VITAMIN) tablet Take 1 tablet by mouth daily.   Yes [provider]  metroNIDAZOLE (FLAGYL) 500 MG tablet Take 1 tablet (500 mg total) by mouth 2 (two) times daily. 12/25/19   Robyn Haber, MD    Family History Family History  Family history unknown: Yes    Social History Social History   Tobacco Use  . Smoking status: Never Smoker  . Smokeless tobacco: Never Used  Substance Use Topics  . Alcohol use: Not Currently  . Drug use: Not Currently     Allergies   Sulfamethoxazole-trimethoprim, Other, Penicillins, Bupropion, and Lamotrigine   Review of Systems Review of  Systems See HPI  Physical Exam Triage Vital Signs ED Triage Vitals  Enc Vitals Group     BP --      Pulse Rate 01/16/20 1822 87     Resp 01/16/20 1822 18     Temp 01/16/20 1822 98.7 F (37.1 C)     Temp Source 01/16/20 1822 Oral     SpO2 01/16/20 1822 98 %     Weight --      Height --      Head Circumference --      Peak Flow --      Pain Score 01/16/20 1819 5     Pain Loc --      Pain Edu? --      Excl. in [place]? --    No data found.  Updated Vital Signs BP (!) 167/103 (BP Location: Left Arm)   Pulse 87   Temp 98.7 F (37.1 C) (Oral)   Resp 18   SpO2 98%      Physical Exam Constitutional:      General: She is not in acute distress.    Appearance: She is well-developed.  HENT:     Head: Normocephalic and atraumatic.  Eyes:     Conjunctiva/sclera: Conjunctivae normal.     Pupils: Pupils are equal, round, and reactive to light.  Cardiovascular:     Rate and Rhythm: Normal rate.  Pulmonary:     Effort: Pulmonary effort is normal. No  respiratory distress.  Abdominal:     General: There is no distension.     Palpations: Abdomen is soft.  Musculoskeletal:        General: Normal range of motion.     Cervical back: Normal range of motion.     Comments: From the mid forearm all the way down to the fingertips there is extensive soft tissue swelling.  There is ecchymosis of the volar aspect of the distal forearm and wrist.  Tenderness over the distal radius.  Very limited range of motion.  Skin:    General: Skin is warm and dry.  Neurological:     Mental Status: She is alert.      UC Treatments / Results  Labs (all labs ordered are listed, but only abnormal results are displayed) Labs Reviewed - No data to display  EKG   Radiology DG Wrist Complete Right  Result Date: 01/16/2020 CLINICAL DATA:  74 year old female with fall. EXAM: RIGHT WRIST - COMPLETE 3+ VIEW COMPARISON:  None. FINDINGS: There is a comminuted fracture of the distal radius with probable  extension into the articular surface. There is mild dorsal displacement and angulation of the distal fracture fragment. No other acute fracture identified. The bones are osteopenic. There is no dislocation. There is arthritic changes of the base of the thumb. There is diffuse subcutaneous edema and soft tissue swelling of the wrist. IMPRESSION: Comminuted intra-articular appearing fracture of the distal radius with mild dorsal displacement and angulation of the distal fracture fragment. Electronically Signed   By: Anner Crete M.D.   On: 01/16/2020 19:39    Procedures Procedures (including critical care time)  Medications Ordered in UC Medications - No data to display  Initial Impression / Assessment and Plan / UC Course  I have reviewed the triage vital signs and the nursing notes.  Pertinent labs & imaging results that were available during my care of the patient were reviewed by me and considered in my medical decision making (see chart for details).     Splinted.  Referred to orthopedic Final Clinical Impressions(s) / UC Diagnoses   Final diagnoses:  Other closed intra-articular fracture of distal end of left radius, initial encounter     Discharge Instructions     [facility] has a hand orthopedic office in [place].  You can reach them at [phone]  Use ice and elevation to reduce pain and swelling    ED Prescriptions    None     PDMP not reviewed this encounter.   Raylene Everts, MD 01/16/20 2017

## 2020-01-16 NOTE — ED Triage Notes (Signed)
Patient presents to urgent care today with symptoms of fall yesterday after tripping over her dog. Pt c/o right hand and wrist pain and swelling. She has been taking tylenol #3 for the pain.

## 2020-01-16 NOTE — Progress Notes (Addendum)
Orthopedic Tech Progress Note Patient Details:  Julia Riley [birth year] [id number]  Ortho Devices Type of Ortho Device: Arm sling, Volar splint Ortho Device/Splint Location: rue Ortho Device/Splint Interventions: Ordered, Application, Adjustment  pts arm was very swollen. Post Interventions Patient Tolerated: Well Instructions Provided: Care of device, Adjustment of device   Karolee Stamps 01/16/2020, 8:31 PM

## 2020-01-16 NOTE — Discharge Instructions (Addendum)
[facility] has a Engineer, maintenance (IT) office in [place].  You can reach them at [phone]  Use ice and elevation to reduce pain and swelling

## 2020-01-18 DIAGNOSIS — S52571A Other intraarticular fracture of lower end of right radius, initial encounter for closed fracture: Secondary | ICD-10-CM | POA: Diagnosis not present

## 2020-01-19 ENCOUNTER — Other Ambulatory Visit: Payer: Self-pay

## 2020-01-19 ENCOUNTER — Encounter (HOSPITAL_BASED_OUTPATIENT_CLINIC_OR_DEPARTMENT_OTHER): Payer: Self-pay | Admitting: Orthopedic Surgery

## 2020-01-23 ENCOUNTER — Other Ambulatory Visit (HOSPITAL_COMMUNITY): Payer: Medicare Other | Attending: Orthopedic Surgery

## 2020-01-24 ENCOUNTER — Other Ambulatory Visit (HOSPITAL_COMMUNITY)
Admission: RE | Admit: 2020-01-24 | Discharge: 2020-01-24 | Disposition: A | Discharge status: Home / Self Care | Payer: Medicare Other | Source: Ambulatory Visit | Attending: Orthopedic Surgery | Admitting: Orthopedic Surgery

## 2020-01-24 DIAGNOSIS — Z20822 Contact with and (suspected) exposure to covid-19: Secondary | ICD-10-CM | POA: Insufficient documentation

## 2020-01-24 DIAGNOSIS — Z01812 Encounter for preprocedural laboratory examination: Secondary | ICD-10-CM | POA: Diagnosis not present

## 2020-01-25 ENCOUNTER — Other Ambulatory Visit: Payer: Self-pay | Admitting: Orthopedic Surgery

## 2020-01-25 LAB — SARS CORONAVIRUS 2 (TAT 6-24 HRS): SARS Coronavirus 2: NEGATIVE

## 2020-01-26 ENCOUNTER — Ambulatory Visit (HOSPITAL_BASED_OUTPATIENT_CLINIC_OR_DEPARTMENT_OTHER): Payer: Medicare Other | Admitting: Anesthesiology

## 2020-01-26 ENCOUNTER — Encounter (HOSPITAL_BASED_OUTPATIENT_CLINIC_OR_DEPARTMENT_OTHER): Payer: Self-pay | Admitting: Orthopedic Surgery

## 2020-01-26 ENCOUNTER — Other Ambulatory Visit: Payer: Self-pay

## 2020-01-26 ENCOUNTER — Encounter (HOSPITAL_BASED_OUTPATIENT_CLINIC_OR_DEPARTMENT_OTHER)
Admission: RE | Disposition: A | Discharge status: Home / Self Care | Payer: Self-pay | Source: Home / Self Care | Attending: Orthopedic Surgery

## 2020-01-26 ENCOUNTER — Ambulatory Visit (HOSPITAL_BASED_OUTPATIENT_CLINIC_OR_DEPARTMENT_OTHER)
Admission: RE | Admit: 2020-01-26 | Discharge: 2020-01-26 | Disposition: A | Discharge status: Home / Self Care | Payer: Medicare Other | Attending: Orthopedic Surgery | Admitting: Orthopedic Surgery

## 2020-01-26 DIAGNOSIS — S52571A Other intraarticular fracture of lower end of right radius, initial encounter for closed fracture: Secondary | ICD-10-CM | POA: Insufficient documentation

## 2020-01-26 DIAGNOSIS — Z79899 Other long term (current) drug therapy: Secondary | ICD-10-CM | POA: Insufficient documentation

## 2020-01-26 DIAGNOSIS — Z87891 Personal history of nicotine dependence: Secondary | ICD-10-CM | POA: Diagnosis not present

## 2020-01-26 DIAGNOSIS — Z7951 Long term (current) use of inhaled steroids: Secondary | ICD-10-CM | POA: Diagnosis not present

## 2020-01-26 DIAGNOSIS — W541XXA Struck by dog, initial encounter: Secondary | ICD-10-CM | POA: Diagnosis not present

## 2020-01-26 DIAGNOSIS — J45909 Unspecified asthma, uncomplicated: Secondary | ICD-10-CM | POA: Diagnosis not present

## 2020-01-26 HISTORY — PX: OPEN REDUCTION INTERNAL FIXATION (ORIF) DISTAL RADIAL FRACTURE: SHX5989

## 2020-01-26 HISTORY — DX: Other complications of anesthesia, initial encounter: T88.59XA

## 2020-01-26 HISTORY — DX: Unspecified asthma, uncomplicated: J45.909

## 2020-01-26 SURGERY — OPEN REDUCTION INTERNAL FIXATION (ORIF) DISTAL RADIUS FRACTURE
Anesthesia: Monitor Anesthesia Care | Laterality: Right

## 2020-01-26 MED ORDER — ONDANSETRON HCL 4 MG/2ML IJ SOLN
INTRAMUSCULAR | Status: AC
  Filled 2020-01-26: qty 2

## 2020-01-26 MED ORDER — ROPIVACAINE HCL 5 MG/ML IJ SOLN
INTRAMUSCULAR | Status: DC | PRN
Start: 2020-01-26 — End: 2020-01-26
  Administered 2020-01-26: 20 mL via PERINEURAL

## 2020-01-26 MED ORDER — FENTANYL CITRATE (PF) 100 MCG/2ML IJ SOLN
50.0000 ug | Freq: Once | INTRAMUSCULAR | Status: AC
  Administered 2020-01-26: 50 ug via INTRAVENOUS

## 2020-01-26 MED ORDER — FENTANYL CITRATE (PF) 100 MCG/2ML IJ SOLN: 25.0000 ug | INTRAMUSCULAR | Status: DC | PRN

## 2020-01-26 MED ORDER — PHENYLEPHRINE 40 MCG/ML (10ML) SYRINGE FOR IV PUSH (FOR BLOOD PRESSURE SUPPORT)
PREFILLED_SYRINGE | INTRAVENOUS | Status: AC
  Filled 2020-01-26: qty 10

## 2020-01-26 MED ORDER — MIDAZOLAM HCL 2 MG/2ML IJ SOLN
1.0000 mg | Freq: Once | INTRAMUSCULAR | Status: AC
  Administered 2020-01-26: 1 mg via INTRAVENOUS

## 2020-01-26 MED ORDER — OXYCODONE-ACETAMINOPHEN 5-325 MG PO TABS: ORAL_TABLET | ORAL | 0 refills | Status: AC

## 2020-01-26 MED ORDER — ACETAMINOPHEN 500 MG PO TABS
ORAL_TABLET | ORAL | Status: AC
  Filled 2020-01-26: qty 2

## 2020-01-26 MED ORDER — LIDOCAINE 2% (20 MG/ML) 5 ML SYRINGE
INTRAMUSCULAR | Status: AC
  Filled 2020-01-26: qty 5

## 2020-01-26 MED ORDER — VANCOMYCIN HCL IN DEXTROSE 1-5 GM/200ML-% IV SOLN
1000.0000 mg | INTRAVENOUS | Status: AC
  Administered 2020-01-26: 1000 mg via INTRAVENOUS

## 2020-01-26 MED ORDER — VANCOMYCIN HCL IN DEXTROSE 1-5 GM/200ML-% IV SOLN
INTRAVENOUS | Status: AC
  Filled 2020-01-26: qty 200

## 2020-01-26 MED ORDER — ACETAMINOPHEN 500 MG PO TABS
1000.0000 mg | ORAL_TABLET | Freq: Once | ORAL | Status: AC
  Administered 2020-01-26: 1000 mg via ORAL

## 2020-01-26 MED ORDER — ONDANSETRON HCL 4 MG/2ML IJ SOLN
INTRAMUSCULAR | Status: DC | PRN
  Administered 2020-01-26: 4 mg via INTRAVENOUS

## 2020-01-26 MED ORDER — SUCCINYLCHOLINE CHLORIDE 200 MG/10ML IV SOSY
PREFILLED_SYRINGE | INTRAVENOUS | Status: AC
  Filled 2020-01-26: qty 10

## 2020-01-26 MED ORDER — MIDAZOLAM HCL 2 MG/2ML IJ SOLN
INTRAMUSCULAR | Status: AC
  Filled 2020-01-26: qty 2

## 2020-01-26 MED ORDER — DIPHENHYDRAMINE HCL 50 MG/ML IJ SOLN
INTRAMUSCULAR | Status: DC | PRN
  Administered 2020-01-26: 6.25 mg via INTRAVENOUS

## 2020-01-26 MED ORDER — PROPOFOL 500 MG/50ML IV EMUL
INTRAVENOUS | Status: DC | PRN
  Administered 2020-01-26: 75 ug/kg/min via INTRAVENOUS

## 2020-01-26 MED ORDER — FENTANYL CITRATE (PF) 100 MCG/2ML IJ SOLN
INTRAMUSCULAR | Status: AC
  Filled 2020-01-26: qty 2

## 2020-01-26 MED ORDER — LACTATED RINGERS IV SOLN: INTRAVENOUS | Status: DC

## 2020-01-26 MED ORDER — EPHEDRINE 5 MG/ML INJ
INTRAVENOUS | Status: AC
  Filled 2020-01-26: qty 10

## 2020-01-26 MED ORDER — DEXAMETHASONE SODIUM PHOSPHATE 10 MG/ML IJ SOLN
INTRAMUSCULAR | Status: DC | PRN
Start: 2020-01-26 — End: 2020-01-26
  Administered 2020-01-26: 5 mg

## 2020-01-26 SURGICAL SUPPLY — 52 items
APL PRP STRL LF DISP 70% ISPRP (MISCELLANEOUS) ×1
BIT DRILL 2.0 LNG QUCK RELEASE (BIT) ×1 IMPLANT
BIT DRILL QC 2.8X5 (BIT) ×2 IMPLANT
BLADE SURG 15 STRL LF DISP TIS (BLADE) ×2 IMPLANT
BLADE SURG 15 STRL SS (BLADE) ×4
BNDG CMPR 9X4 STRL LF SNTH (GAUZE/BANDAGES/DRESSINGS) ×1
BNDG ELASTIC 3X5.8 VLCR STR LF (GAUZE/BANDAGES/DRESSINGS) ×2 IMPLANT
BNDG ESMARK 4X9 LF (GAUZE/BANDAGES/DRESSINGS) ×2 IMPLANT
BNDG GAUZE ELAST 4 BULKY (GAUZE/BANDAGES/DRESSINGS) ×2 IMPLANT
BNDG PLASTER X FAST 3X3 WHT LF (CAST SUPPLIES) ×20 IMPLANT
BNDG PLSTR 9X3 FST ST WHT (CAST SUPPLIES) ×10
CHLORAPREP W/TINT 26 (MISCELLANEOUS) ×2 IMPLANT
CORD BIPOLAR FORCEPS 12FT (ELECTRODE) ×2 IMPLANT
COVER BACK TABLE 60X90IN (DRAPES) ×2 IMPLANT
COVER MAYO STAND STRL (DRAPES) ×2 IMPLANT
CUFF TOURN SGL QUICK 18X4 (TOURNIQUET CUFF) ×2 IMPLANT
DRAPE EXTREMITY T 121X128X90 (DISPOSABLE) ×2 IMPLANT
DRAPE OEC MINIVIEW 54X84 (DRAPES) ×2 IMPLANT
DRAPE SURG 17X23 STRL (DRAPES) ×2 IMPLANT
DRILL 2.0 LNG QUICK RELEASE (BIT) ×2
GAUZE SPONGE 4X4 12PLY STRL (GAUZE/BANDAGES/DRESSINGS) ×2 IMPLANT
GAUZE XEROFORM 1X8 LF (GAUZE/BANDAGES/DRESSINGS) ×2 IMPLANT
GLOVE BIO SURGEON STRL SZ7.5 (GLOVE) ×2 IMPLANT
GLOVE BIOGEL PI IND STRL 8 (GLOVE) ×1 IMPLANT
GLOVE BIOGEL PI IND STRL 8.5 (GLOVE) ×1 IMPLANT
GLOVE BIOGEL PI INDICATOR 8 (GLOVE) ×1
GLOVE BIOGEL PI INDICATOR 8.5 (GLOVE) ×1
GLOVE SURG ORTHO 8.0 STRL STRW (GLOVE) ×2 IMPLANT
GOWN STRL REUS W/ TWL LRG LVL3 (GOWN DISPOSABLE) ×1 IMPLANT
GOWN STRL REUS W/TWL LRG LVL3 (GOWN DISPOSABLE) ×2
GOWN STRL REUS W/TWL XL LVL3 (GOWN DISPOSABLE) ×2 IMPLANT
GUIDEWIRE ORTHO 0.054X6 (WIRE) ×6 IMPLANT
NS IRRIG 1000ML POUR BTL (IV SOLUTION) ×2 IMPLANT
PACK BASIN DAY SURGERY FS (CUSTOM PROCEDURE TRAY) ×2 IMPLANT
PAD CAST 3X4 CTTN HI CHSV (CAST SUPPLIES) ×1 IMPLANT
PADDING CAST COTTON 3X4 STRL (CAST SUPPLIES) ×2
PLATE R NARROW PROC VDR (Plate) ×2 IMPLANT
SCREW CORT FT 18X2.3XLCK HEX (Screw) ×1 IMPLANT
SCREW CORT FT 20X2.3XLCK HEX (Screw) ×1 IMPLANT
SCREW CORTICAL LOCKING 2.3X18M (Screw) ×4 IMPLANT
SCREW CORTICAL LOCKING 2.3X20M (Screw) ×8 IMPLANT
SCREW FX18X2.3XSMTH LCK NS CRT (Screw) ×1 IMPLANT
SCREW FX20X2.3XSMTH LCK NS CRT (Screw) ×3 IMPLANT
SCREW HEXALOBE NON-LOCK 3.5X14 (Screw) ×4 IMPLANT
SCREW NONLOCK HEX 3.5X12 (Screw) ×2 IMPLANT
SLEEVE SCD COMPRESS KNEE MED (MISCELLANEOUS) ×2 IMPLANT
STOCKINETTE 4X48 STRL (DRAPES) ×2 IMPLANT
SUT ETHILON 4 0 PS 2 18 (SUTURE) ×2 IMPLANT
SUT VICRYL 4-0 PS2 18IN ABS (SUTURE) ×2 IMPLANT
SYR BULB EAR ULCER 3OZ GRN STR (SYRINGE) ×2 IMPLANT
TOWEL GREEN STERILE FF (TOWEL DISPOSABLE) ×4 IMPLANT
UNDERPAD 30X36 HEAVY ABSORB (UNDERPADS AND DIAPERS) ×2 IMPLANT

## 2020-01-26 NOTE — Progress Notes (Signed)
Assisted Dr. Woodrum with right, ultrasound guided, supraclavicular block. Side rails up, monitors on throughout procedure. See vital signs in flow sheet. Tolerated Procedure well. 

## 2020-01-26 NOTE — Anesthesia Postprocedure Evaluation (Signed)
Anesthesia Post Note  Patient: Julia Riley  Procedure(s) Performed: OPEN REDUCTION INTERNAL FIXATION (ORIF) DISTAL RADIAL FRACTURE (Right )     Patient location during evaluation: Phase II Anesthesia Type: Regional Level of consciousness: awake Pain management: pain level controlled Vital Signs Assessment: post-procedure vital signs reviewed and stable Respiratory status: spontaneous breathing Cardiovascular status: stable Postop Assessment: no apparent nausea or vomiting Anesthetic complications: no   No complications documented.  Last Vitals:  Vitals:   01/26/20 1645 01/26/20 1659  BP: 118/72   Pulse: 67 73  Resp: 20 (!) 1  Temp:    SpO2: 94% 97%    Last Pain:  Vitals:   01/26/20 1645  TempSrc:   PainSc: 0-No pain                 Huston Foley

## 2020-01-26 NOTE — Anesthesia Procedure Notes (Signed)
Anesthesia Regional Block: Supraclavicular block   Pre-Anesthetic Checklist: ,, timeout performed, Correct Patient, Correct Site, Correct Laterality, Correct Procedure, Correct Position, site marked, Risks and benefits discussed,  Surgical consent,  Pre-op evaluation,  At surgeon's request and post-op pain management  Laterality: Right  Prep: Maximum Sterile Barrier Precautions used, chloraprep       Needles:  Injection technique: Single-shot  Needle Type: Echogenic Stimulator Needle     Needle Length: 9cm  Needle Gauge: 22     Additional Needles:   Procedures:,,,, ultrasound used (permanent image in chart),,,,  Narrative:  Start time: 01/26/2020 1:53 PM End time: 01/26/2020 2:03 PM Injection made incrementally with aspirations every 5 mL.  Performed by: Personally  Anesthesiologist: Freddrick March, MD  Additional Notes: Monitors applied. No increased pain on injection. No increased resistance to injection. Injection made in 5cc increments. Good needle visualization. Patient tolerated procedure well.

## 2020-01-26 NOTE — Op Note (Signed)
01/26/2020 [facility]  Operative Note  Pre Op Diagnosis: Right comminuted intraarticular distal radius fracture  Post Op Diagnosis: Right comminuted intraarticular distal radius fracture  Procedure:  1. ORIF Right comminuted intraarticular distal radius fracture, 3 intraarticular fragments 2. Right brachioradialis release  Surgeon: Leanora Cover, MD  Assistant: Daryll Brod, MD  Anesthesia: Regional with sedation  Fluids: Per anesthesia flow sheet  EBL: minimal  Complications: None  Specimen: None  Tourniquet Time:  Total Tourniquet Time Documented: Upper Arm (Right) - 44 minutes Total: Upper Arm (Right) - 44 minutes   Disposition: Stable to PACU  INDICATIONS:  Julia Riley is a 74 y.o. female states she was knocked over by her dog.  XR revealed right distal radius fracture.  Splinted and followed up in office.  We discussed nonoperative and operative treatment options.  She wished to proceed with operative fixation.  Risks, benefits, and alternatives of surgery were discussed including the risk of blood loss; infection; damage to nerves, vessels, tendons, ligaments, bone; failure of surgery; need for additional surgery; complications with wound healing; continued pain; nonunion; malunion; stiffness.  We also discussed the possible need for bone graft and the benefits and risks including the possibility of disease transmission.  She voiced understanding of these risks and elected to proceed.    OPERATIVE COURSE:  After being identified preoperatively by myself, the patient and I agreed upon the procedure and site of procedure.  Surgical site was marked.  The risks, benefits and alternatives of the surgery were reviewed and she wished to proceed.  Surgical consent had been signed.  She was given preoperative antibiotic prophylaxis.  She was transferred to the operating room and placed on the operating room table in supine position with the Right upper extremity  on an armboard. Sedation was induced by the anesthesiologist.  A regional block had been performed by anesthesia in preoperative holding.  The Right upper extremity was prepped and draped in normal sterile orthopedic fashion.  A surgical pause was performed between the surgeons, anesthesia and operating room staff, and all were in agreement as to the patient, procedure and site of procedure.  Tourniquet at the proximal aspect of the extremity was inflated to 250 mmHg after exsanguination of the limb with an Esmarch bandage.  Standard volar Mallie Mussel approach was used.  The bipolar electrocautery was used to obtain hemostasis.  The superficial and deep portions of the FCR tendon sheath were incised, and the FCR and FPL were swept ulnarly to protect the palmar cutaneous branch of the median nerve.  The brachioradialis was released at the radial side of the radius.  The pronator quadratus was released and elevated with the periosteal elevator.  The fracture site was identified and cleared of soft tissue interposition and hematoma.  It was reduced under direct visualization.  There was intraarticular extension creating three intraarticular fragments.   An AcuMed volar distal radial locking plate was selected.  It was secured to the bone with the guidepins.  C-arm was used in AP and lateral projections to ensure appropriate reduction and position of the hardware and adjustments made as necessary.  Standard AO drilling and measuring technique was used.  A single screw was placed in the slotted hole in the shaft of the plate.  The distal holes were filled with locking pegs with the exception of the styloid holes, which were filled with locking screws.  The remaining holes in the shaft of the plate were filled with nonlocking screws.  Good purchase  was obtained.  C-arm was used in AP, lateral and oblique projections to ensure appropriate reduction and position of hardware, which was the case.  There was no intra-articular  penetration of hardware.  The wound was copiously irrigated with sterile saline.  Pronator quadratus was repaired back over top of the plate using 4-0 Vicryl suture.  Vicryl suture was placed in the subcutaneous tissues in an inverted interrupted fashion and the skin was closed with 4-0 nylon in a horizontal mattress fashion.  There was good pronation and supination of the wrist without crepitance.  The wound was then dressed with sterile Xeroform, 4x4s, and wrapped with a Kerlix bandage.  A volar splint was placed and wrapped with Kerlix and Ace bandage.  Tourniquet was deflated at 44 minutes.  Fingertips were pink with brisk capillary refill after deflation of the tourniquet.  Operative drapes were broken down.  The patient was awoken from anesthesia safely.  She was transferred back to the stretcher and taken to the PACU in stable condition.  I will see her back in the office in one week for postoperative followup.  I will give her a prescription for Percocet 5/325 1-2 tabs PO q6 hours prn pain, dispense # 30.    Leanora Cover, MD Electronically signed, 01/26/20

## 2020-01-26 NOTE — Discharge Instructions (Addendum)
[facility] Instructions Hand Surgery  Wound Care: Keep your hand elevated above the level of your heart.  Do not allow it to dangle by your side.  Keep the dressing dry and do not remove it unless your doctor advises you to do so.  He will usually change it at the time of your post-op visit.  Moving your fingers is advised to stimulate circulation but will depend on the site of your surgery.  If you have a splint applied, your doctor will advise you regarding movement.  Activity: Do not drive or operate machinery today.  Rest today and then you may return to your normal activity and work as indicated by your physician.  Diet:  Drink liquids today or eat a light diet.  You may resume a regular diet tomorrow.    General expectations: Pain for two to three days. Fingers may become slightly swollen.  Call your doctor if any of the following occur: Severe pain not relieved by pain medication. Elevated temperature. Dressing soaked with blood. Inability to move fingers. White or bluish color to fingers.   NO TYLENOL PRODUCTS UNTIL 6:45 PM   Post Anesthesia Home Care Instructions  Activity: Get plenty of rest for the remainder of the day. A responsible individual must stay with you for 24 hours following the procedure.  For the next 24 hours, DO NOT: -Drive a car -Paediatric nurse -Drink alcoholic beverages -Take any medication unless instructed by your physician -Make any legal decisions or sign important papers.  Meals: Start with liquid foods such as gelatin or soup. Progress to regular foods as tolerated. Avoid greasy, spicy, heavy foods. If nausea and/or vomiting occur, drink only clear liquids until the nausea and/or vomiting subsides. Call your physician if vomiting continues.  Special Instructions/Symptoms: Your throat may feel dry or sore from the anesthesia or the breathing tube placed in your throat during surgery. If this causes discomfort, gargle with warm salt water.  The discomfort should disappear within 24 hours.  If you had a scopolamine patch placed behind your ear for the management of post- operative nausea and/or vomiting:  1. The medication in the patch is effective for 72 hours, after which it should be removed.  Wrap patch in a tissue and discard in the trash. Wash hands thoroughly with soap and water. 2. You may remove the patch earlier than 72 hours if you experience unpleasant side effects which may include dry mouth, dizziness or visual disturbances. 3. Avoid touching the patch. Wash your hands with soap and water after contact with the patch.      Regional Anesthesia Blocks  1. Numbness or the inability to move the "blocked" extremity may last from 3-48 hours after placement. The length of time depends on the medication injected and your individual response to the medication. If the numbness is not going away after 48 hours, call your surgeon.  2. The extremity that is blocked will need to be protected until the numbness is gone and the  Strength has returned. Because you cannot feel it, you will need to take extra care to avoid injury. Because it may be weak, you may have difficulty moving it or using it. You may not know what position it is in without looking at it while the block is in effect.  3. For blocks in the legs and feet, returning to weight bearing and walking needs to be done carefully. You will need to wait until the numbness is entirely gone and the strength  has returned. You should be able to move your leg and foot normally before you try and bear weight or walk. You will need someone to be with you when you first try to ensure you do not fall and possibly risk injury.  4. Bruising and tenderness at the needle site are common side effects and will resolve in a few days.  5. Persistent numbness or new problems with movement should be communicated to the surgeon or the [place] [phone] [place] [phone]).

## 2020-01-26 NOTE — H&P (Signed)
Julia Riley is an 74 y.o. female.   Chief Complaint: right wrist fracture HPI: 74 yo female with right distal radius fracture sustained when he dog knocked her over.  Seen in ED where XR revealed distal radius fracture.  Splinted and followed up in office.  She wishes to proceed with operative fixation.    Allergies:  Allergies  Allergen Reactions  . Sulfamethoxazole-Trimethoprim Anaphylaxis  . Other Dermatitis    "anything with fragrance" topical  . Penicillins Hives  . Bupropion Other (See Comments)    insomnia  . Doxycycline Other (See Comments)    Diarrhea, yeast infection, nausea, shakes  . Lamotrigine Rash    Past Medical History:  Diagnosis Date  . Asthma   . Complication of anesthesia    states she was awake during ear surgery    Past Surgical History:  Procedure Laterality Date  . DILATION AND CURETTAGE OF UTERUS    . EYE SURGERY Bilateral 2019   cataracts  . INNER EAR SURGERY Bilateral last in 2018   stapedectomy    Family History: Family History  Family history unknown: Yes    Social History:   reports that she has quit smoking. Her smoking use included cigarettes. She has never used smokeless tobacco. She reports previous alcohol use. She reports previous drug use.  Medications: Medications Prior to Admission  Medication Sig Dispense Refill  . Fluticasone-Salmeterol (ADVAIR) 100-50 MCG/DOSE AEPB Inhale 1 puff into the lungs as needed.    . folic acid (FOLVITE) 1 MG tablet Take 1 tablet by mouth daily.    Marland Kitchen HYDROcodone-acetaminophen (NORCO/VICODIN) 5-325 MG tablet Take 1-2 tablets by mouth every 6 (six) hours as needed for severe pain. 15 tablet 0  . methotrexate (RHEUMATREX) 2.5 MG tablet Take 3 tablets by mouth once a week.    . Multiple Vitamin (MULTI-VITAMIN) tablet Take 1 tablet by mouth daily.    Marland Kitchen ketoconazole (NIZORAL) 2 % cream Apply 1 application topically 2 (two) times daily. 30 g 0    No results found for this or any previous visit  (from the past 48 hour(s)).  No results found.   A comprehensive review of systems was negative.  Blood pressure 131/66, pulse 69, temperature 98.8 F (37.1 C), temperature source Oral, resp. rate 19, height 5\' 10"  (1.778 m), weight 78.6 kg, SpO2 100 %.  General appearance: alert, cooperative and appears stated age Head: Normocephalic, without obvious abnormality, atraumatic Neck: supple, symmetrical, trachea midline Cardio: regular rate and rhythm Resp: clear to auscultation bilaterally Extremities: Intact sensation and capillary refill all digits.  +epl/fpl/io.  No wounds.  Pulses: 2+ and symmetric Skin: Skin color, texture, turgor normal. No rashes or lesions Neurologic: Grossly normal Incision/Wound: none  Assessment/Plan Right distal radius fracture.  Non operative and operative treatment options have been discussed with the patient and patient wishes to proceed with operative treatment. Risks, benefits, and alternatives of surgery have been discussed and the patient agrees with the plan of care.   Leanora Cover 01/26/2020, 3:06 PM

## 2020-01-26 NOTE — Transfer of Care (Signed)
Immediate Anesthesia Transfer of Care Note  Patient: Julia Riley  Procedure(s) Performed: OPEN REDUCTION INTERNAL FIXATION (ORIF) DISTAL RADIAL FRACTURE (Right )  Patient Location: PACU  Anesthesia Type:MAC combined with regional for post-op pain  Level of Consciousness: awake, alert  and oriented  Airway & Oxygen Therapy: Patient Spontanous Breathing and Patient connected to face mask oxygen  Post-op Assessment: Report given to RN and Post -op Vital signs reviewed and stable  Post vital signs: Reviewed and stable  Last Vitals:  Vitals Value Taken Time  BP    Temp    Pulse 65 01/26/20 1624  Resp 14 01/26/20 1624  SpO2 98 % 01/26/20 1624  Vitals shown include unvalidated device data.  Last Pain:  Vitals:   01/26/20 1239  TempSrc: Oral  PainSc: 2          Complications: No complications documented.

## 2020-01-26 NOTE — Anesthesia Preprocedure Evaluation (Addendum)
Anesthesia Evaluation  Patient identified by MRN, date of birth, ID band Patient awake    Reviewed: Allergy & Precautions, NPO status , Patient's Chart, lab work & pertinent test results  Airway Mallampati: III  TM Distance: >3 FB Neck ROM: Full  Mouth opening: Limited Mouth Opening  Dental no notable dental hx. (+) Teeth Intact, Dental Advisory Given   Pulmonary asthma , former smoker,    Pulmonary exam normal breath sounds clear to auscultation       Cardiovascular negative cardio ROS Normal cardiovascular exam Rhythm:Regular Rate:Normal     Neuro/Psych negative neurological ROS  negative psych ROS   GI/Hepatic negative GI ROS, Neg liver ROS,   Endo/Other  negative endocrine ROS  Renal/GU negative Renal ROS  negative genitourinary   Musculoskeletal negative musculoskeletal ROS (+)   Abdominal   Peds  Hematology negative hematology ROS (+)   Anesthesia Other Findings   Reproductive/Obstetrics                            Anesthesia Physical Anesthesia Plan  ASA: II  Anesthesia Plan: MAC and Regional   Post-op Pain Management:  Regional for Post-op pain   Induction: Intravenous  PONV Risk Score and Plan: 2 and Propofol infusion, Treatment may vary due to age or medical condition, Midazolam and Ondansetron  Airway Management Planned: Natural Airway  Additional Equipment:   Intra-op Plan:   Post-operative Plan:   Informed Consent: I have reviewed the patients History and Physical, chart, labs and discussed the procedure including the risks, benefits and alternatives for the proposed anesthesia with the patient or authorized representative who has indicated his/her understanding and acceptance.     Dental advisory given  Plan Discussed with: CRNA  Anesthesia Plan Comments:         Anesthesia Quick Evaluation

## 2020-01-26 NOTE — Op Note (Signed)
I assisted Surgeon(s) and Role:    * Leanora Cover, MD - Primary    Daryll Brod, MD - Assisting on the Procedure(s): OPEN REDUCTION INTERNAL FIXATION (ORIF) DISTAL RADIAL FRACTURE on 01/26/2020.  I provided assistance on this case as follows: Set up, approach, debridement of the fracture, reduction of the fracture and stabilization, application of the plate and screws for fixation, closure of the wound with application of dressing and splints  Electronically signed by: Daryll Brod, MD Date: 01/26/2020 Time: 4:19 PM

## 2020-01-27 ENCOUNTER — Encounter (HOSPITAL_BASED_OUTPATIENT_CLINIC_OR_DEPARTMENT_OTHER): Payer: Self-pay | Admitting: Orthopedic Surgery

## 2020-02-01 DIAGNOSIS — S52571D Other intraarticular fracture of lower end of right radius, subsequent encounter for closed fracture with routine healing: Secondary | ICD-10-CM | POA: Diagnosis not present

## 2020-02-14 DIAGNOSIS — M25631 Stiffness of right wrist, not elsewhere classified: Secondary | ICD-10-CM | POA: Diagnosis not present

## 2020-02-14 DIAGNOSIS — S52571D Other intraarticular fracture of lower end of right radius, subsequent encounter for closed fracture with routine healing: Secondary | ICD-10-CM | POA: Diagnosis not present

## 2020-02-14 DIAGNOSIS — M25531 Pain in right wrist: Secondary | ICD-10-CM | POA: Diagnosis not present

## 2020-02-14 DIAGNOSIS — M25431 Effusion, right wrist: Secondary | ICD-10-CM | POA: Diagnosis not present

## 2020-02-21 DIAGNOSIS — S52571D Other intraarticular fracture of lower end of right radius, subsequent encounter for closed fracture with routine healing: Secondary | ICD-10-CM | POA: Diagnosis not present

## 2020-02-21 DIAGNOSIS — M25431 Effusion, right wrist: Secondary | ICD-10-CM | POA: Diagnosis not present

## 2020-02-21 DIAGNOSIS — M25531 Pain in right wrist: Secondary | ICD-10-CM | POA: Diagnosis not present

## 2020-02-21 DIAGNOSIS — M25641 Stiffness of right hand, not elsewhere classified: Secondary | ICD-10-CM | POA: Diagnosis not present

## 2020-02-21 DIAGNOSIS — M25631 Stiffness of right wrist, not elsewhere classified: Secondary | ICD-10-CM | POA: Diagnosis not present

## 2020-02-24 DIAGNOSIS — S52571D Other intraarticular fracture of lower end of right radius, subsequent encounter for closed fracture with routine healing: Secondary | ICD-10-CM | POA: Diagnosis not present

## 2020-02-24 DIAGNOSIS — M25431 Effusion, right wrist: Secondary | ICD-10-CM | POA: Diagnosis not present

## 2020-02-24 DIAGNOSIS — M25531 Pain in right wrist: Secondary | ICD-10-CM | POA: Diagnosis not present

## 2020-02-24 DIAGNOSIS — M25641 Stiffness of right hand, not elsewhere classified: Secondary | ICD-10-CM | POA: Diagnosis not present

## 2020-02-24 DIAGNOSIS — M25631 Stiffness of right wrist, not elsewhere classified: Secondary | ICD-10-CM | POA: Diagnosis not present

## 2020-02-28 DIAGNOSIS — S52571D Other intraarticular fracture of lower end of right radius, subsequent encounter for closed fracture with routine healing: Secondary | ICD-10-CM | POA: Diagnosis not present

## 2020-02-29 DIAGNOSIS — M25641 Stiffness of right hand, not elsewhere classified: Secondary | ICD-10-CM | POA: Diagnosis not present

## 2020-02-29 DIAGNOSIS — M25531 Pain in right wrist: Secondary | ICD-10-CM | POA: Diagnosis not present

## 2020-02-29 DIAGNOSIS — S52571D Other intraarticular fracture of lower end of right radius, subsequent encounter for closed fracture with routine healing: Secondary | ICD-10-CM | POA: Diagnosis not present

## 2020-02-29 DIAGNOSIS — M25631 Stiffness of right wrist, not elsewhere classified: Secondary | ICD-10-CM | POA: Diagnosis not present

## 2020-02-29 DIAGNOSIS — M25431 Effusion, right wrist: Secondary | ICD-10-CM | POA: Diagnosis not present

## 2020-03-08 DIAGNOSIS — M25641 Stiffness of right hand, not elsewhere classified: Secondary | ICD-10-CM | POA: Diagnosis not present

## 2020-03-08 DIAGNOSIS — M25631 Stiffness of right wrist, not elsewhere classified: Secondary | ICD-10-CM | POA: Diagnosis not present

## 2020-03-08 DIAGNOSIS — M25531 Pain in right wrist: Secondary | ICD-10-CM | POA: Diagnosis not present

## 2020-03-08 DIAGNOSIS — M25431 Effusion, right wrist: Secondary | ICD-10-CM | POA: Diagnosis not present

## 2020-03-08 DIAGNOSIS — S52571D Other intraarticular fracture of lower end of right radius, subsequent encounter for closed fracture with routine healing: Secondary | ICD-10-CM | POA: Diagnosis not present

## 2020-03-13 DIAGNOSIS — M25631 Stiffness of right wrist, not elsewhere classified: Secondary | ICD-10-CM | POA: Diagnosis not present

## 2020-03-13 DIAGNOSIS — S52571D Other intraarticular fracture of lower end of right radius, subsequent encounter for closed fracture with routine healing: Secondary | ICD-10-CM | POA: Diagnosis not present

## 2020-03-13 DIAGNOSIS — M25531 Pain in right wrist: Secondary | ICD-10-CM | POA: Diagnosis not present

## 2020-03-13 DIAGNOSIS — M25431 Effusion, right wrist: Secondary | ICD-10-CM | POA: Diagnosis not present

## 2020-03-13 DIAGNOSIS — M25641 Stiffness of right hand, not elsewhere classified: Secondary | ICD-10-CM | POA: Diagnosis not present

## 2020-03-14 DIAGNOSIS — Z8719 Personal history of other diseases of the digestive system: Secondary | ICD-10-CM | POA: Diagnosis not present

## 2020-03-14 DIAGNOSIS — Z1211 Encounter for screening for malignant neoplasm of colon: Secondary | ICD-10-CM | POA: Diagnosis not present

## 2020-03-14 DIAGNOSIS — Z86010 Personal history of colonic polyps: Secondary | ICD-10-CM | POA: Diagnosis not present

## 2020-03-14 DIAGNOSIS — K5730 Diverticulosis of large intestine without perforation or abscess without bleeding: Secondary | ICD-10-CM | POA: Diagnosis not present

## 2020-03-14 DIAGNOSIS — D120 Benign neoplasm of cecum: Secondary | ICD-10-CM | POA: Diagnosis not present

## 2020-03-14 DIAGNOSIS — Z800 Family history of malignant neoplasm of digestive organs: Secondary | ICD-10-CM | POA: Diagnosis not present

## 2020-03-14 DIAGNOSIS — K635 Polyp of colon: Secondary | ICD-10-CM | POA: Diagnosis not present

## 2020-03-19 DIAGNOSIS — Z03818 Encounter for observation for suspected exposure to other biological agents ruled out: Secondary | ICD-10-CM | POA: Diagnosis not present

## 2020-03-19 DIAGNOSIS — Z20822 Contact with and (suspected) exposure to covid-19: Secondary | ICD-10-CM | POA: Diagnosis not present

## 2020-03-22 DIAGNOSIS — R5383 Other fatigue: Secondary | ICD-10-CM | POA: Diagnosis not present

## 2020-03-22 DIAGNOSIS — Z20822 Contact with and (suspected) exposure to covid-19: Secondary | ICD-10-CM | POA: Diagnosis not present

## 2020-03-22 DIAGNOSIS — R509 Fever, unspecified: Secondary | ICD-10-CM | POA: Diagnosis not present

## 2020-03-27 DIAGNOSIS — R5383 Other fatigue: Secondary | ICD-10-CM | POA: Diagnosis not present

## 2020-03-27 DIAGNOSIS — Z79899 Other long term (current) drug therapy: Secondary | ICD-10-CM | POA: Diagnosis not present

## 2020-03-27 DIAGNOSIS — J4520 Mild intermittent asthma, uncomplicated: Secondary | ICD-10-CM | POA: Diagnosis not present

## 2020-03-27 DIAGNOSIS — R519 Headache, unspecified: Secondary | ICD-10-CM | POA: Diagnosis not present

## 2020-03-27 DIAGNOSIS — J441 Chronic obstructive pulmonary disease with (acute) exacerbation: Secondary | ICD-10-CM | POA: Diagnosis not present

## 2020-04-09 DIAGNOSIS — M25531 Pain in right wrist: Secondary | ICD-10-CM | POA: Diagnosis not present

## 2020-04-09 DIAGNOSIS — M25631 Stiffness of right wrist, not elsewhere classified: Secondary | ICD-10-CM | POA: Diagnosis not present

## 2020-04-09 DIAGNOSIS — R29898 Other symptoms and signs involving the musculoskeletal system: Secondary | ICD-10-CM | POA: Diagnosis not present

## 2020-04-09 DIAGNOSIS — S52571D Other intraarticular fracture of lower end of right radius, subsequent encounter for closed fracture with routine healing: Secondary | ICD-10-CM | POA: Diagnosis not present

## 2020-04-09 DIAGNOSIS — M25431 Effusion, right wrist: Secondary | ICD-10-CM | POA: Diagnosis not present

## 2020-04-09 DIAGNOSIS — M25641 Stiffness of right hand, not elsewhere classified: Secondary | ICD-10-CM | POA: Diagnosis not present

## 2020-04-10 DIAGNOSIS — S52571D Other intraarticular fracture of lower end of right radius, subsequent encounter for closed fracture with routine healing: Secondary | ICD-10-CM | POA: Diagnosis not present

## 2020-05-03 DIAGNOSIS — Z23 Encounter for immunization: Secondary | ICD-10-CM | POA: Diagnosis not present

## 2020-05-21 DIAGNOSIS — E785 Hyperlipidemia, unspecified: Secondary | ICD-10-CM | POA: Diagnosis not present

## 2020-05-21 DIAGNOSIS — Z79899 Other long term (current) drug therapy: Secondary | ICD-10-CM | POA: Diagnosis not present

## 2020-05-21 DIAGNOSIS — Z0000 Encounter for general adult medical examination without abnormal findings: Secondary | ICD-10-CM | POA: Diagnosis not present

## 2020-05-21 DIAGNOSIS — J4520 Mild intermittent asthma, uncomplicated: Secondary | ICD-10-CM | POA: Diagnosis not present

## 2020-05-21 DIAGNOSIS — D7589 Other specified diseases of blood and blood-forming organs: Secondary | ICD-10-CM | POA: Diagnosis not present

## 2020-05-21 DIAGNOSIS — Z23 Encounter for immunization: Secondary | ICD-10-CM | POA: Diagnosis not present

## 2020-05-21 DIAGNOSIS — R29898 Other symptoms and signs involving the musculoskeletal system: Secondary | ICD-10-CM | POA: Diagnosis not present

## 2020-05-28 DIAGNOSIS — R29898 Other symptoms and signs involving the musculoskeletal system: Secondary | ICD-10-CM | POA: Diagnosis not present

## 2020-06-07 DIAGNOSIS — M810 Age-related osteoporosis without current pathological fracture: Secondary | ICD-10-CM | POA: Diagnosis not present

## 2020-06-08 DIAGNOSIS — Z79899 Other long term (current) drug therapy: Secondary | ICD-10-CM | POA: Diagnosis not present

## 2020-06-08 DIAGNOSIS — Z5181 Encounter for therapeutic drug level monitoring: Secondary | ICD-10-CM | POA: Diagnosis not present

## 2020-06-08 DIAGNOSIS — L2389 Allergic contact dermatitis due to other agents: Secondary | ICD-10-CM | POA: Diagnosis not present

## 2020-06-08 DIAGNOSIS — R238 Other skin changes: Secondary | ICD-10-CM | POA: Diagnosis not present

## 2021-03-21 ENCOUNTER — Other Ambulatory Visit: Payer: Self-pay

## 2021-03-21 ENCOUNTER — Emergency Department (HOSPITAL_COMMUNITY)
Admission: EM | Admit: 2021-03-21 | Discharge: 2021-03-21 | Disposition: A | Discharge status: Home / Self Care | Payer: Medicare Other | Attending: Emergency Medicine | Admitting: Emergency Medicine

## 2021-03-21 ENCOUNTER — Encounter (HOSPITAL_COMMUNITY): Payer: Self-pay | Admitting: Emergency Medicine

## 2021-03-21 ENCOUNTER — Emergency Department (HOSPITAL_COMMUNITY): Payer: Medicare Other

## 2021-03-21 DIAGNOSIS — R000 Tachycardia, unspecified: Secondary | ICD-10-CM | POA: Diagnosis not present

## 2021-03-21 DIAGNOSIS — J1282 Pneumonia due to coronavirus disease 2019: Secondary | ICD-10-CM | POA: Insufficient documentation

## 2021-03-21 DIAGNOSIS — J189 Pneumonia, unspecified organism: Secondary | ICD-10-CM

## 2021-03-21 DIAGNOSIS — J4521 Mild intermittent asthma with (acute) exacerbation: Secondary | ICD-10-CM | POA: Diagnosis not present

## 2021-03-21 DIAGNOSIS — U071 COVID-19: Secondary | ICD-10-CM | POA: Insufficient documentation

## 2021-03-21 DIAGNOSIS — Z87891 Personal history of nicotine dependence: Secondary | ICD-10-CM | POA: Diagnosis not present

## 2021-03-21 DIAGNOSIS — R0602 Shortness of breath: Secondary | ICD-10-CM | POA: Diagnosis present

## 2021-03-21 DIAGNOSIS — R059 Cough, unspecified: Secondary | ICD-10-CM

## 2021-03-21 LAB — COMPREHENSIVE METABOLIC PANEL
ALT: 23 U/L (ref 0–44)
AST: 23 U/L (ref 15–41)
Albumin: 3.3 g/dL — ABNORMAL LOW (ref 3.5–5.0)
Alkaline Phosphatase: 84 U/L (ref 38–126)
Anion gap: 10 (ref 5–15)
BUN: 8 mg/dL (ref 8–23)
CO2: 26 mmol/L (ref 22–32)
Calcium: 10.6 mg/dL — ABNORMAL HIGH (ref 8.9–10.3)
Chloride: 99 mmol/L (ref 98–111)
Creatinine, Ser: 0.79 mg/dL (ref 0.44–1.00)
GFR, Estimated: >60 mL/min (ref >60)
Glucose, Bld: 144 mg/dL — ABNORMAL HIGH (ref 70–99)
Potassium: 3.9 mmol/L (ref 3.5–5.1)
Sodium: 135 mmol/L (ref 135–145)
Total Bilirubin: 0.8 mg/dL (ref 0.3–1.2)
Total Protein: 6.8 g/dL (ref 6.5–8.1)

## 2021-03-21 LAB — CBC WITH DIFFERENTIAL/PLATELET
Abs Immature Granulocytes: 0.04 10*3/uL (ref 0.00–0.07)
Basophils Absolute: 0.1 10*3/uL (ref 0.0–0.1)
Basophils Relative: 0 %
Eosinophils Absolute: 0.1 10*3/uL (ref 0.0–0.5)
Eosinophils Relative: 1 %
HCT: 40.4 % (ref 36.0–46.0)
Hemoglobin: 12.9 g/dL (ref 12.0–15.0)
Immature Granulocytes: 0 %
Lymphocytes Relative: 9 %
Lymphs Abs: 1 10*3/uL (ref 0.7–4.0)
MCH: 31.8 pg (ref 26.0–34.0)
MCHC: 31.9 g/dL (ref 30.0–36.0)
MCV: 99.5 fL (ref 80.0–100.0)
Monocytes Absolute: 0.7 10*3/uL (ref 0.1–1.0)
Monocytes Relative: 6 %
Neutro Abs: 9.6 10*3/uL — ABNORMAL HIGH (ref 1.7–7.7)
Neutrophils Relative %: 84 %
Platelets: 437 10*3/uL — ABNORMAL HIGH (ref 150–400)
RBC: 4.06 MIL/uL (ref 3.87–5.11)
RDW: 13.4 % (ref 11.5–15.5)
WBC: 11.4 10*3/uL — ABNORMAL HIGH (ref 4.0–10.5)
nRBC: 0 % (ref 0.0–0.2)

## 2021-03-21 LAB — LACTIC ACID, PLASMA: Lactic Acid, Venous: 1.3 mmol/L (ref 0.5–1.9)

## 2021-03-21 LAB — PROTIME-INR
INR: 1.1 (ref 0.8–1.2)
Prothrombin Time: 13.8 seconds (ref 11.4–15.2)

## 2021-03-21 LAB — TROPONIN I (HIGH SENSITIVITY)
Troponin I (High Sensitivity): 15 ng/L (ref <18)
Troponin I (High Sensitivity): 18 ng/L — ABNORMAL HIGH (ref <18)

## 2021-03-21 LAB — APTT: aPTT: 32 seconds (ref 24–36)

## 2021-03-21 MED ORDER — IOHEXOL 350 MG/ML SOLN
75.0000 mL | Freq: Once | INTRAVENOUS | Status: AC | PRN
  Administered 2021-03-21: 75 mL via INTRAVENOUS

## 2021-03-21 MED ORDER — ALBUTEROL SULFATE HFA 108 (90 BASE) MCG/ACT IN AERS
2.0000 | INHALATION_SPRAY | Freq: Once | RESPIRATORY_TRACT | Status: AC
  Administered 2021-03-21: 2 via RESPIRATORY_TRACT
  Filled 2021-03-21: qty 6.7

## 2021-03-21 MED ORDER — IPRATROPIUM-ALBUTEROL 0.5-2.5 (3) MG/3ML IN SOLN
3.0000 mL | Freq: Once | RESPIRATORY_TRACT | Status: DC
  Filled 2021-03-21: qty 3

## 2021-03-21 MED ORDER — AZITHROMYCIN 250 MG PO TABS: 250.0000 mg | ORAL_TABLET | Freq: Every day | ORAL | 0 refills | Status: AC

## 2021-03-21 MED ORDER — ACETAMINOPHEN 325 MG PO TABS
650.0000 mg | ORAL_TABLET | Freq: Once | ORAL | Status: AC
  Administered 2021-03-21: 650 mg via ORAL
  Filled 2021-03-21: qty 2

## 2021-03-21 MED ORDER — DEXAMETHASONE 4 MG PO TABS
10.0000 mg | ORAL_TABLET | Freq: Once | ORAL | Status: AC
  Administered 2021-03-21: 10 mg via ORAL
  Filled 2021-03-21: qty 3

## 2021-03-21 MED ORDER — CEFPODOXIME PROXETIL 100 MG PO TABS: 200.0000 mg | ORAL_TABLET | Freq: Two times a day (BID) | ORAL | 0 refills | Status: AC

## 2021-03-21 NOTE — ED Notes (Signed)
ED Provider at bedside. 

## 2021-03-21 NOTE — ED Provider Notes (Signed)
Emergency Medicine Provider Triage Evaluation Note  Julia Riley , a 75 y.o. female  was evaluated in triage.  Patient presents with complaints of acute dyspnea tonight. She states she has been since since around 03/03/21 with congestion, sinus pressure, loss of smell, cough, and fevers. She has had some very mild dyspnea at times. She was in [place] from 09/08-09/18, had positive covid test, received paxlovid 03/11/21 and took this as prescribe with some improvement in her sxs. However she has continued fevers at home, productive cough, and tonight woke up very short of breath gasping for air prompting ED visit. She has had some chest congestion/tightness, no pain. Received covid vaccine with booster x 3.   Denies leg pain/swelling, hemoptysis, recent surgery/trauma, hormone use, personal hx of cancer, or hx of DVT/PE. Recent trip to [place].   Review of Systems  Positive: Dyspnea, fever, cough, congestion, loss of smell Negative: Vomiting, syncope, hemoptysis, leg pain/swelling chest pain.   Physical Exam  BP (!) 158/87 (BP Location: Left Arm)   Pulse (!) 120   Temp 99.1 F (37.3 C) (Oral)   Resp 16   SpO2 98%  Gen:   Awake, no distress   Resp:  Normal effort, SPO2 97% on RA.  Cardiac:  Tachycardic.  MSK:  No significant LE edema.   Medical Decision Making  Medically screening exam initiated at 4:27 AM.  Appropriate orders placed.  Julia Riley was informed that the remainder of the evaluation will be completed by another provider, this initial triage assessment does not replace that evaluation, and the importance of remaining in the ED until their evaluation is complete.  Patient ill since 03/03/21, recently completed course of paxlovid for covid 19.  She has an oral temp of 99.1 with HR in the 120s, no hypotension. Plan for sepsis work-up, will obtain CTA for PE as well given her recent covid, extended travel, and acute onset dyspnea.    Julia Dyke,  PA-C 03/21/21 0431    Julia Essex, MD 03/21/21 [phone]

## 2021-03-21 NOTE — ED Triage Notes (Signed)
Patient tested positive for Covid19 last Sept. 19 , treated with Paxlovid , reports SOB with chest congestion /productive cough this morning , no fever or chills .

## 2021-03-21 NOTE — Progress Notes (Signed)
Pt came from ED waiting room for a CTA PE study. I placed a 22g Diffusics IV, which I left in . Pt returned to waiting room, RN advised.

## 2021-03-21 NOTE — ED Provider Notes (Signed)
[facility] EMERGENCY DEPARTMENT Provider Note   CSN: [id number] Arrival date & time: 03/21/21  0334     History Chief Complaint  Patient presents with   Covid+ / SOB     Julia Riley is a 75 y.o. female with past medical history for asthma who reports she has had COVID persistent symptoms since 12th September.  Patient reports that she began experiencing symptoms after a flight.  Patient returned to her home, was tested on the 19th.  Tested positive and began taking paxlovid.  Patient reports she finished her course of Paxlovid.  Has had persistent cough, congestion, without chest pain or shortness of breath.  Patient in the emergency department because she had an event last night where she woke up from sleep because she was not breathing, it took her a few seconds to regain her breath.  Patient reports that she was able to breathe normally after that point, however she was very scared by not being able seconds.  Patient reports that she has inhaler at home and she tried to use it once but that it was expired, did not have any effect.  HPI     Past Medical History:  Diagnosis Date   Asthma    Complication of anesthesia    states she was awake during ear surgery    There are no problems to display for this patient.   Past Surgical History:  Procedure Laterality Date   DILATION AND CURETTAGE OF UTERUS     EYE SURGERY Bilateral 2019   cataracts   INNER EAR SURGERY Bilateral last in 2018   stapedectomy   OPEN REDUCTION INTERNAL FIXATION (ORIF) DISTAL RADIAL FRACTURE Right 01/26/2020   Procedure: OPEN REDUCTION INTERNAL FIXATION (ORIF) DISTAL RADIAL FRACTURE;  Surgeon: Leanora Cover, MD;  Location: [place];  Service: Orthopedics;  Laterality: Right;     OB History   No obstetric history on file.     Family History  Family history unknown: Yes    Social History   Tobacco Use   Smoking status: Former    Types: Cigarettes    Smokeless tobacco: Never  Substance Use Topics   Alcohol use: Not Currently   Drug use: Not Currently    Home Medications Prior to Admission medications   Medication Sig Start Date End Date Taking? Authorizing Provider  azithromycin (ZITHROMAX) 250 MG tablet Take 1 tablet (250 mg total) by mouth daily. Take first 2 tablets together, then 1 every day until finished. 03/21/21  Yes Gareth Morgan, MD  cefpodoxime (VANTIN) 100 MG tablet Take 2 tablets (200 mg total) by mouth 2 (two) times daily for 5 days. 03/21/21 03/26/21 Yes Schlossman, Junie Panning, MD  Fluticasone-Salmeterol (ADVAIR) 100-50 MCG/DOSE AEPB Inhale 1 puff into the lungs as needed. 11/10/19   [provider]  folic acid (FOLVITE) 1 MG tablet Take 1 tablet by mouth daily. 08/26/16   [provider]  ketoconazole (NIZORAL) 2 % cream Apply 1 application topically 2 (two) times daily. 12/25/19   Robyn Haber, MD  methotrexate (RHEUMATREX) 2.5 MG tablet Take 3 tablets by mouth once a week. 10/08/16   [provider]  Multiple Vitamin (MULTI-VITAMIN) tablet Take 1 tablet by mouth daily.    [provider]  oxyCODONE-acetaminophen (PERCOCET) 5-325 MG tablet 1-2 tabs PO q6 hours prn pain 01/26/20   Leanora Cover, MD    Allergies    Sulfamethoxazole-trimethoprim, Other, Penicillins, Bupropion, Doxycycline, and Lamotrigine  Review of Systems   Review of  Systems  Respiratory:  Positive for cough, shortness of breath and wheezing. Negative for chest tightness.   Cardiovascular:  Negative for chest pain.  All other systems reviewed and are negative.  Physical Exam Updated Vital Signs BP 130/81 (BP Location: Right Arm)   Pulse 88   Temp 98.2 F (36.8 C) (Oral)   Resp 20   SpO2 96%   Physical Exam Vitals and nursing note reviewed.  Constitutional:      General: She is not in acute distress.    Appearance: Normal appearance.  HENT:     Head: Normocephalic and atraumatic.     Nose: Congestion present.   Eyes:     General:        Right eye: No discharge.        Left eye: No discharge.     Comments: Conjunctivae are red, irritated  Cardiovascular:     Rate and Rhythm: Regular rhythm. Tachycardia present.     Heart sounds: No murmur heard.   No friction rub. No gallop.  Pulmonary:     Effort: Pulmonary effort is normal.     Breath sounds: Wheezing and rhonchi present.  Abdominal:     General: Bowel sounds are normal.     Palpations: Abdomen is soft.     Tenderness: There is no abdominal tenderness.  Skin:    General: Skin is warm and dry.     Capillary Refill: Capillary refill takes less than 2 seconds.  Neurological:     Mental Status: She is alert and oriented to person, place, and time.  Psychiatric:        Mood and Affect: Mood normal.        Behavior: Behavior normal.    ED Results / Procedures / Treatments   Labs (all labs ordered are listed, but only abnormal results are displayed) Labs Reviewed  CBC WITH DIFFERENTIAL/PLATELET - Abnormal; Notable for the following components:      Result Value   WBC 11.4 (*)    Platelets 437 (*)    Neutro Abs 9.6 (*)    All other components within normal limits  COMPREHENSIVE METABOLIC PANEL - Abnormal; Notable for the following components:   Glucose, Bld 144 (*)    Calcium 10.6 (*)    Albumin 3.3 (*)    All other components within normal limits  TROPONIN I (HIGH SENSITIVITY) - Abnormal; Notable for the following components:   Troponin I (High Sensitivity) 18 (*)    All other components within normal limits  CULTURE, BLOOD (ROUTINE X 2)  CULTURE, BLOOD (ROUTINE X 2)  PROTIME-INR  APTT  LACTIC ACID, PLASMA  LACTIC ACID, PLASMA  TROPONIN I (HIGH SENSITIVITY)    EKG EKG Interpretation  Date/Time:  Thursday March 21 2021 04:06:02 EDT Ventricular Rate:  111 PR Interval:  128 QRS Duration: 74 QT Interval:  322 QTC Calculation: 437 R Axis:   78 Text Interpretation: Sinus tachycardia Otherwise normal ECG No previous  ECGs available Confirmed by Gareth Morgan [phone]) on 03/21/2021 2:04:22 PM  Radiology DG Chest 2 View  Result Date: 03/21/2021 CLINICAL DATA:  Shortness of breath with chest congestion EXAM: CHEST - 2 VIEW COMPARISON:  None. FINDINGS: Large lung volumes with mild with diaphragm flattening. There is no edema, consolidation, effusion, or pneumothorax. Normal heart size. Mild aortic tortuosity Focal advanced disc degeneration at the thoracolumbar junction. IMPRESSION: No focal or definite pneumonia. Large lung volumes. Electronically Signed   By: Jorje Guild M.D.   On: 03/21/2021 05:15  CT Angio Chest PE W/Cm &/Or Wo Cm  Result Date: 03/21/2021 CLINICAL DATA:  PE suspected, high prob. COVID. Shortness of breath, congestion EXAM: CT ANGIOGRAPHY CHEST WITH CONTRAST TECHNIQUE: Multidetector CT imaging of the chest was performed using the standard protocol during bolus administration of intravenous contrast. Multiplanar CT image reconstructions and MIPs were obtained to evaluate the vascular anatomy. CONTRAST:  5mL OMNIPAQUE IOHEXOL 350 MG/ML SOLN COMPARISON:  None. FINDINGS: Cardiovascular: No filling defects in the pulmonary arteries to suggest pulmonary emboli. Heart is normal size. Aorta is normal caliber. Coronary artery and aortic calcifications. Retroesophageal right subclavian artery noted. Mediastinum/Nodes: No mediastinal, hilar, or axillary adenopathy. Trachea and esophagus are unremarkable. Thyroid unremarkable. Lungs/Pleura: Mild emphysema. Peribronchial thickening. Opacity in the right hilar region in the inferior right upper lobe could reflect scarring or less likely developing infiltrate. Scarring in the lingula. No effusions. Upper Abdomen: Imaging into the upper abdomen demonstrates no acute findings. Musculoskeletal: Chest wall soft tissues are unremarkable. No acute bony abnormality. Review of the MIP images confirms the above findings. IMPRESSION: No evidence of pulmonary embolus.  Airspace opacity in the right perihilar region in the inferior right upper lobe could reflect scarring or early infiltrate. Mild peribronchial thickening/bronchitis. Coronary artery calcifications. Aortic Atherosclerosis (ICD10-I70.0) and Emphysema (ICD10-J43.9). Electronically Signed   By: Rolm Baptise M.D.   On: 03/21/2021 09:19    Procedures Procedures   Medications Ordered in ED Medications  acetaminophen (TYLENOL) tablet 650 mg (650 mg Oral Given 03/21/21 0409)  iohexol (OMNIPAQUE) 350 MG/ML injection 75 mL (75 mLs Intravenous Contrast Given 03/21/21 0850)  dexamethasone (DECADRON) tablet 10 mg (10 mg Oral Given 03/21/21 1401)  albuterol (VENTOLIN HFA) 108 (90 Base) MCG/ACT inhaler 2 puff (2 puffs Inhalation Given 03/21/21 1422)    ED Course  I have reviewed the triage vital signs and the nursing notes.  Pertinent labs & imaging results that were available during my care of the patient were reviewed by me and considered in my medical decision making (see chart for details).    MDM Rules/Calculators/A&P                         I discussed this case with my attending physician who cosigned this note including patient's presenting symptoms, physical exam, and planned diagnostics and interventions. Attending physician stated agreement with plan or made changes to plan which were implemented.   Attending physician assessed patient at bedside.  Questionable ongoing COVID versus small infiltrate noted on CTA potentially consistent with early pneumonia.  Patient has some wheezing, and history of asthma.  We will treat as an asthma exacerbation with DuoNeb, corticosteroids.  CTA was negative for pulmonary embolism.  Patient has troponin of 18, without resting tachycardia, favor demand ischemia. Unclear etiology  No evidence of PE based on CTA. Other labwork unremarkable other than a mild hypercalcemia and elevated WBC count which is expected in setting of known infection.  Patient given albuterol  and decadron 10mg  while in the office today. Sent home with albuterol nebulizer. Patient's tachycardia improve significantly to 88 prior to discharge. Given continued feeling of illness despite many days post symptom onset and in context of new questionable infiltrate on CT, will treat for pneumonia at this time. Pt with allergy to penicillins. Will use cefpodixime and azithromycin. Patient understands and agrees to the plan. Discharged in stable condition. Return precautions given. Final Clinical Impression(s) / ED Diagnoses Final diagnoses:  Pneumonia of right middle lobe due to infectious  organism  Mild intermittent asthma with acute exacerbation  Cough  COVID-19    Rx / DC Orders ED Discharge Orders          Ordered    cefpodoxime (VANTIN) 100 MG tablet  2 times daily        03/21/21 1401    azithromycin (ZITHROMAX) 250 MG tablet  Daily        03/21/21 1401             Wiletta Bermingham, Ingram H, PA-C 03/21/21 1449    Gareth Morgan, MD 03/23/21 [phone]

## 2021-03-26 LAB — CULTURE, BLOOD (ROUTINE X 2)
Culture: NO GROWTH
Culture: NO GROWTH
Special Requests: ADEQUATE
Special Requests: ADEQUATE

## 2022-06-27 IMAGING — DX DG WRIST COMPLETE 3+V*R*
4 series · 4 of 4 positions shown · non-contrast
Comparison: None.

CLINICAL DATA: 73-year-old female with fall.

EXAM:
RIGHT WRIST - COMPLETE 3+ VIEW

[wrist pa]
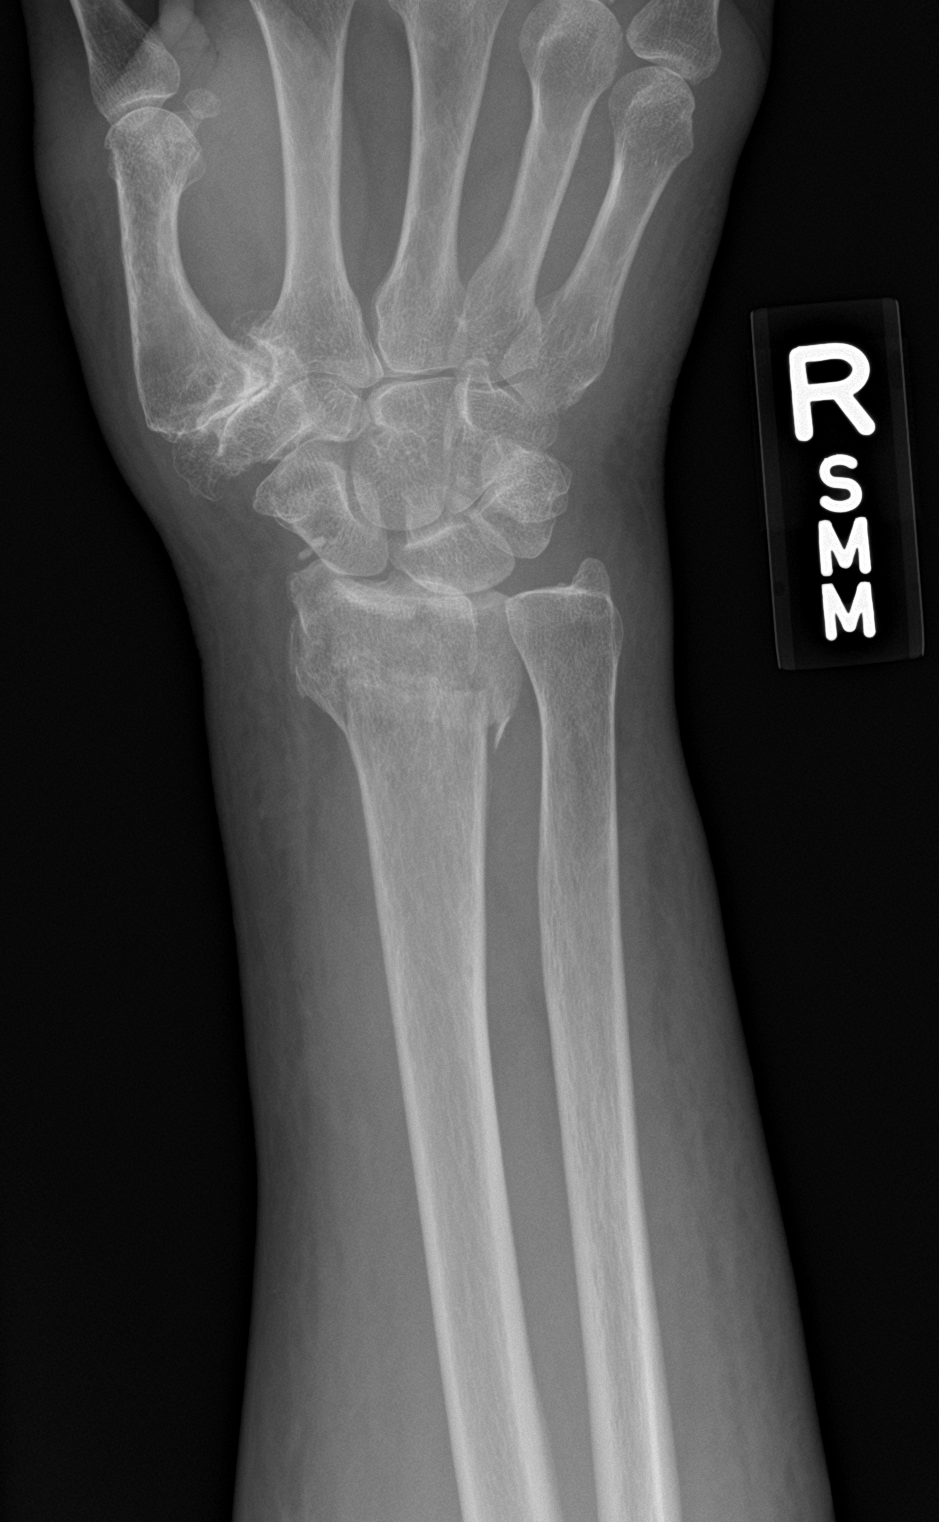

[wrist navicular]
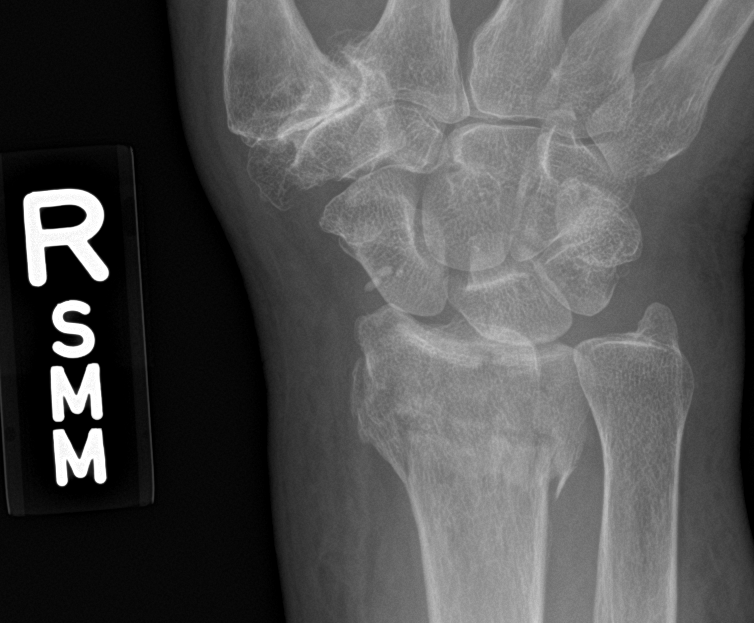

[wrist obl]
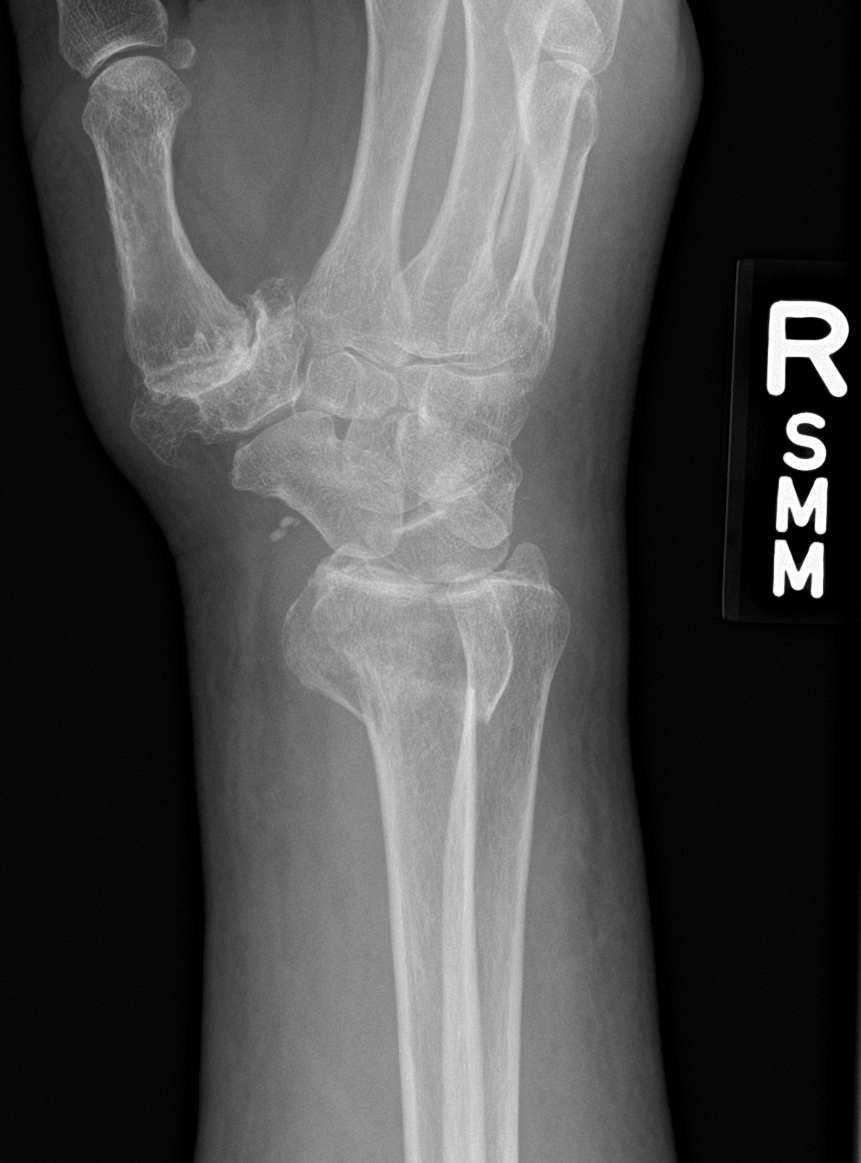

[wrist lat]
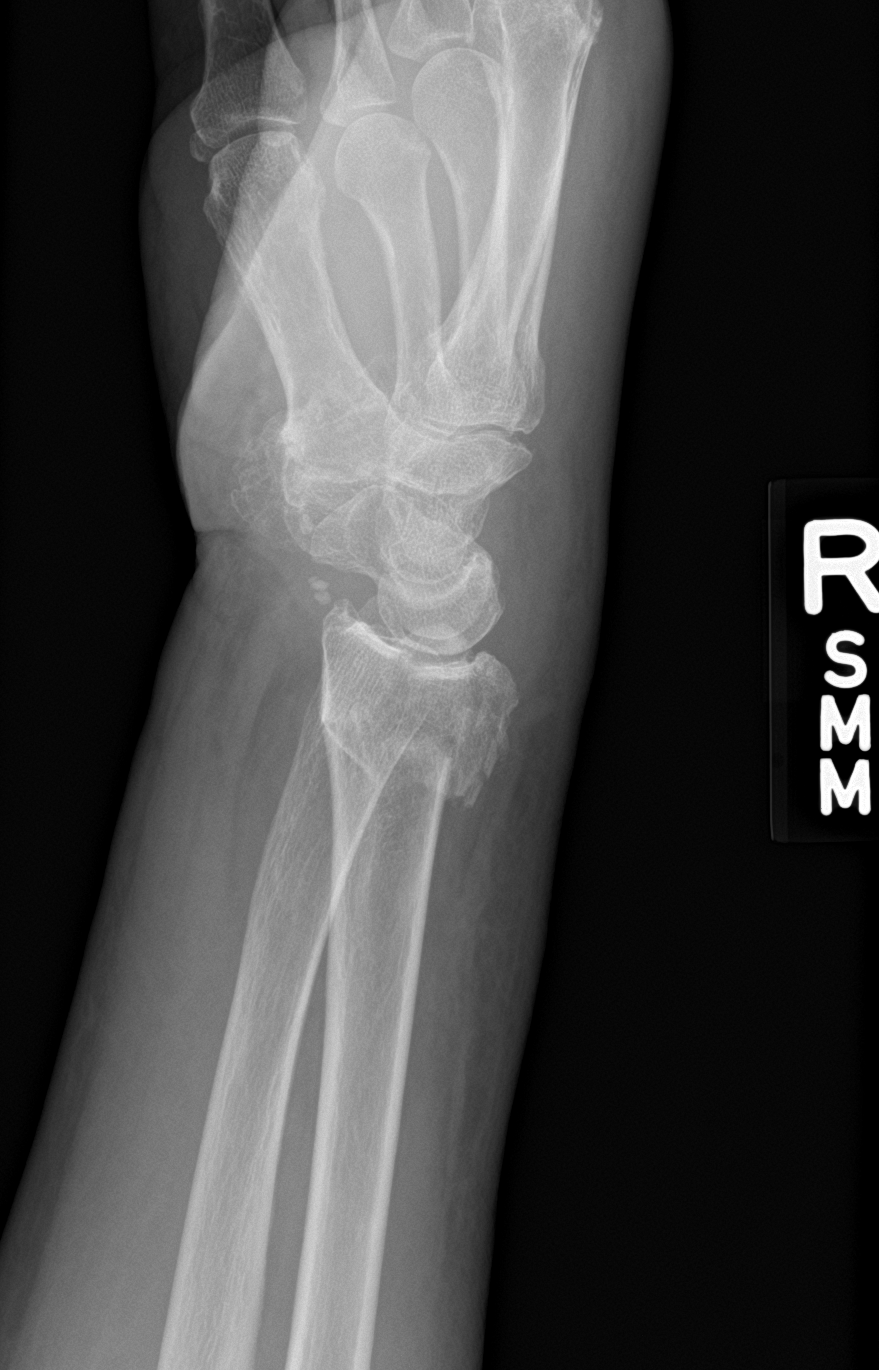

[4 of 4 positions shown; findings below may reference images not displayed]

FINDINGS: There is a comminuted fracture of the distal radius with probable
extension into the articular surface. There is mild dorsal
displacement and angulation of the distal fracture fragment. No
other acute fracture identified. The bones are osteopenic. There is
no dislocation. There is arthritic changes of the base of the thumb.
There is diffuse subcutaneous edema and soft tissue swelling of the
wrist.
IMPRESSION: Comminuted intra-articular appearing fracture of the distal radius
with mild dorsal displacement and angulation of the distal fracture
fragment.
